# Patient Record
Sex: Male | Born: 1965
Health system: Southern US, Community
[De-identification: ages and names within clinical notes are randomized; demographics above are authoritative.]

## PROBLEM LIST (undated history)

## (undated) DIAGNOSIS — M1990 Unspecified osteoarthritis, unspecified site: Secondary | ICD-10-CM

## (undated) DIAGNOSIS — H9190 Unspecified hearing loss, unspecified ear: Secondary | ICD-10-CM

## (undated) DIAGNOSIS — H269 Unspecified cataract: Secondary | ICD-10-CM

## (undated) DIAGNOSIS — R000 Tachycardia, unspecified: Secondary | ICD-10-CM

## (undated) DIAGNOSIS — F79 Unspecified intellectual disabilities: Secondary | ICD-10-CM

## (undated) DIAGNOSIS — F840 Autistic disorder: Secondary | ICD-10-CM

## (undated) DIAGNOSIS — Z9289 Personal history of other medical treatment: Secondary | ICD-10-CM

## (undated) HISTORY — DX: Unspecified cataract: H26.9

## (undated) HISTORY — PX: INGUINAL HERNIA REPAIR: SUR1180

## (undated) HISTORY — PX: EYE SURGERY: SHX253

## (undated) HISTORY — PX: HERNIA REPAIR: SHX51

## (undated) HISTORY — DX: Unspecified intellectual disabilities: F79

## (undated) HISTORY — DX: Personal history of other medical treatment: Z92.89

---

## 2011-03-16 ENCOUNTER — Emergency Department (INDEPENDENT_AMBULATORY_CARE_PROVIDER_SITE_OTHER): Payer: 59

## 2011-03-16 ENCOUNTER — Encounter: Payer: Self-pay | Admitting: *Deleted

## 2011-03-16 ENCOUNTER — Emergency Department (INDEPENDENT_AMBULATORY_CARE_PROVIDER_SITE_OTHER)
Admission: EM | Admit: 2011-03-16 | Discharge: 2011-03-16 | Disposition: A | Discharge status: Home / Self Care | Payer: Medicare Other | Source: Home / Self Care | Attending: Emergency Medicine | Admitting: Emergency Medicine

## 2011-03-16 DIAGNOSIS — G629 Polyneuropathy, unspecified: Secondary | ICD-10-CM

## 2011-03-16 DIAGNOSIS — I872 Venous insufficiency (chronic) (peripheral): Secondary | ICD-10-CM

## 2011-03-16 DIAGNOSIS — H9190 Unspecified hearing loss, unspecified ear: Secondary | ICD-10-CM | POA: Insufficient documentation

## 2011-03-16 DIAGNOSIS — G609 Hereditary and idiopathic neuropathy, unspecified: Secondary | ICD-10-CM

## 2011-03-16 HISTORY — DX: Unspecified hearing loss, unspecified ear: H91.90

## 2011-03-16 LAB — POCT I-STAT, CHEM 8
Chloride: 106 mEq/L (ref 96–112)
HCT: 53 % — ABNORMAL HIGH (ref 39.0–52.0)
Potassium: 4 mEq/L (ref 3.5–5.1)

## 2011-03-16 MED ORDER — TRAMADOL HCL 50 MG PO TABS
100.0000 mg | ORAL_TABLET | Freq: Three times a day (TID) | ORAL | Status: AC | PRN
Start: 2011-03-16 — End: 2011-03-26

## 2011-03-16 NOTE — ED Provider Notes (Signed)
History     CSN: [id number] Arrival date & time: 03/16/2011  6:25 PM   First MD Initiated Contact with Patient 03/16/11 1736      Chief Complaint  Patient presents with  . Leg Pain    (Consider location/radiation/quality/duration/timing/severity/associated sxs/prior treatment) HPI Comments: Clif is a 45 year old male who is deaf, does not speak, does not use sign language, and cannot read or write. He communicates through his father who communicates with him by gestures. He has had a history for many months of pain in both of his ankles. Over the past week the pain seems to be moving up the legs into the bilateral pretibial areas. He seems to have pain when he walks. There is no swelling. It's hard to say whether there is any numbness or weakness because he can't communicate very well. He hasn't had any obvious systemic symptoms such as fever, chills, weight loss, abdominal pain, back pain, or urinary symptoms. He does not have a history of diabetes. He did complain of some lower back pain.  Patient is a 45 y.o. male presenting with leg pain.  Leg Pain  Pertinent negatives include no numbness.    Past Medical History  Diagnosis Date  . Deaf     Past Surgical History  Procedure Date  . Hernia repair     History reviewed. No pertinent family history.  History  Substance Use Topics  . Smoking status: Never Smoker   . Smokeless tobacco: Not on file  . Alcohol Use: No      Review of Systems  Musculoskeletal: Positive for back pain and arthralgias. Negative for myalgias, joint swelling and gait problem.  Skin: Negative for rash and wound.  Neurological: Negative for weakness and numbness.    Allergies  Review of patient's allergies indicates no known allergies.  Home Medications   Current Outpatient Rx  Name Route Sig Dispense Refill  . TRAMADOL HCL 50 MG PO TABS Oral Take 2 tablets (100 mg total) by mouth every 8 (eight) hours as needed for pain. Maximum dose= 8  tablets per day 30 tablet 0    BP 124/81  Pulse 89  Temp(Src) 98.5 F (36.9 C) (Oral)  Resp 18  SpO2 100%  Physical Exam  Nursing note and vitals reviewed. Constitutional: He appears well-developed and well-nourished. No distress.  Neck: No JVD present.  Cardiovascular: Normal rate, regular rhythm, normal heart sounds and intact distal pulses.  Exam reveals no gallop and no friction rub.   No murmur heard. Pulmonary/Chest: Effort normal and breath sounds normal. No respiratory distress. He has no wheezes. He has no rales.  Abdominal: Soft. Bowel sounds are normal. He exhibits no distension and no mass. There is no tenderness. There is no rebound and no guarding.  Musculoskeletal: Normal range of motion. He exhibits tenderness. He exhibits no edema.       He has tenderness in both pretibial areas. There is no calf tenderness and Homans sign appears to be negative. There is no edema. Ankles and knees have a full range of motion with no pain. Muscle strength is normal, posterior tibial pulses are full.  Lymphadenopathy:    He has no cervical adenopathy.  Skin: Skin is warm and dry. No rash noted. He is not diaphoretic.    ED Course  Procedures (including critical care time)  Results for orders placed during the hospital encounter of 03/16/11  POCT I-STAT, CHEM 8      Component Value Range   Sodium 141  135 - 145 (mEq/L)   Potassium 4.0  3.5 - 5.1 (mEq/L)   Chloride 106  96 - 112 (mEq/L)   BUN 16  6 - 23 (mg/dL)   Creatinine, Ser 1.61  0.50 - 1.35 (mg/dL)   Glucose, Bld 82  70 - 99 (mg/dL)   Calcium, Ion 0.96  0.45 - 1.32 (mmol/L)   TCO2 25  0 - 100 (mmol/L)   Hemoglobin 18.0 (*) 13.0 - 17.0 (g/dL)   HCT 40.9 (*) 81.1 - 52.0 (%)     Labs Reviewed  POCT I-STAT, CHEM 8 - Abnormal; Notable for the following:    Hemoglobin 18.0 (*)    HCT 53.0 (*)    All other components within normal limits  I-STAT, CHEM 8   Dg Tibia/fibula Left  03/16/2011  *RADIOLOGY REPORT*  Clinical  Data: Left leg pain.  LEFT TIBIA AND FIBULA - 2 VIEW  Comparison: None  Findings: The knee and ankle joints are maintained.  No acute bony findings.  IMPRESSION: No acute fracture.  Original Report Authenticated By: P. Loralie Champagne, M.D.   Dg Tibia/fibula Right  03/16/2011  *RADIOLOGY REPORT*  Clinical Data: Leg pain  RIGHT TIBIA AND FIBULA - 2 VIEW  Comparison: None.  Findings: No fracture or dislocation is seen.  The joint spaces are preserved.  The visualized soft tissues are unremarkable.  IMPRESSION: No acute osseous abnormality is seen.  Original Report Authenticated By: Charline Bills, M.D.     1. Venous insufficiency   2. Peripheral neuropathy       MDM  He has chronic bilateral pretibial pain. This is compatible either with venous insufficiency or neuropathy or both. All try support hose, tramadol for the pain, and have advised him to followup with his primary care physician in one week.        Roque Lias, MD 03/16/11 [phone]

## 2011-03-16 NOTE — ED Notes (Signed)
Pt deaf, father reports pt co pain in right lower leg, x longer than 1 week, ambulates with difficulty, denies injury to leg, no hx of medical problems

## 2011-04-27 ENCOUNTER — Encounter: Payer: Self-pay | Admitting: Internal Medicine

## 2011-04-27 DIAGNOSIS — Z0000 Encounter for general adult medical examination without abnormal findings: Secondary | ICD-10-CM | POA: Insufficient documentation

## 2011-04-29 ENCOUNTER — Emergency Department (INDEPENDENT_AMBULATORY_CARE_PROVIDER_SITE_OTHER)
Admission: EM | Admit: 2011-04-29 | Discharge: 2011-04-29 | Disposition: A | Discharge status: Home / Self Care | Payer: Medicare Other | Source: Home / Self Care | Attending: Emergency Medicine | Admitting: Emergency Medicine

## 2011-04-29 ENCOUNTER — Emergency Department (INDEPENDENT_AMBULATORY_CARE_PROVIDER_SITE_OTHER): Payer: 59

## 2011-04-29 ENCOUNTER — Encounter (HOSPITAL_COMMUNITY): Payer: Self-pay | Admitting: *Deleted

## 2011-04-29 DIAGNOSIS — R112 Nausea with vomiting, unspecified: Secondary | ICD-10-CM

## 2011-04-29 HISTORY — DX: Unspecified osteoarthritis, unspecified site: M19.90

## 2011-04-29 MED ORDER — ONDANSETRON HCL 4 MG PO TABS: 4.0000 mg | ORAL_TABLET | Freq: Four times a day (QID) | ORAL | Status: AC

## 2011-04-29 MED ORDER — OMEPRAZOLE 20 MG PO CPDR: 40.0000 mg | DELAYED_RELEASE_CAPSULE | Freq: Every day | ORAL | Status: DC

## 2011-04-29 NOTE — ED Notes (Signed)
Pt  Is deaf.  He is here with his father who states pt does not read lips or sign.    Onset 2 days ago generalized  ABD pain with N and V and D.    Last BM was this morning  And pt did not have much appetite.    Denies fever

## 2011-04-29 NOTE — ED Provider Notes (Signed)
History     CSN: [id number]  Arrival date & time 04/29/11  1102   First MD Initiated Contact with Patient 04/29/11 1131      Chief Complaint  Patient presents with  . Abdominal Pain    (Consider location/radiation/quality/duration/timing/severity/associated sxs/prior treatment) HPI Comments: X 2 days, with abdominal pains, and vomited x 3 yesterday and one this morning. [place] having diarrheas (father thinks) unable to confirm with their sign language) No fevers No SOB  Patient is a 46 y.o. male presenting with abdominal pain. The history is provided by a caregiver (deaf and mute). No language interpreter was used.  Abdominal Pain The primary symptoms of the illness include abdominal pain, nausea, vomiting and diarrhea. The primary symptoms of the illness do not include fever, fatigue, shortness of breath, dysuria, vaginal discharge or vaginal bleeding. The current episode started yesterday. The onset of the illness was gradual. The problem has not changed since onset. Additional symptoms associated with the illness include anorexia. Symptoms associated with the illness do not include chills, constipation or back pain.    Past Medical History  Diagnosis Date  . Deaf   . Osteoarthritis     Past Surgical History  Procedure Date  . Hernia repair   . Inguinal hernia repair     Family History  Problem Relation Age of Onset  . Diabetes Father   . Coronary artery disease Father     History  Substance Use Topics  . Smoking status: Never Smoker   . Smokeless tobacco: Not on file  . Alcohol Use: No      Review of Systems  Constitutional: Positive for appetite change. Negative for fever, chills and fatigue.  Respiratory: Positive for apnea. Negative for cough and shortness of breath.   Gastrointestinal: Positive for nausea, vomiting, abdominal pain, diarrhea and anorexia. Negative for constipation.  Genitourinary: Negative for dysuria, vaginal bleeding and vaginal  discharge.  Musculoskeletal: Negative for back pain.    Allergies  Review of patient's allergies indicates no known allergies.  Home Medications   Current Outpatient Rx  Name Route Sig Dispense Refill  . OMEPRAZOLE 20 MG PO CPDR Oral Take 2 capsules (40 mg total) by mouth daily. 15 capsule 0  . ONDANSETRON HCL 4 MG PO TABS Oral Take 1 tablet (4 mg total) by mouth every 6 (six) hours. 12 tablet 0    BP 119/77  Pulse 92  Temp(Src) 98.2 F (36.8 C) (Oral)  Resp 20  SpO2 95%  Physical Exam  Nursing note and vitals reviewed. Constitutional: No distress.  HENT:  Head: Normocephalic.  Mouth/Throat: No oropharyngeal exudate.  Eyes: Conjunctivae are normal. No scleral icterus.  Neck: Neck supple.  Pulmonary/Chest: No respiratory distress. He has no decreased breath sounds. He has no wheezes.  Abdominal: Soft. He exhibits distension. He exhibits no mass. There is no tenderness. There is no rebound and no guarding.  Lymphadenopathy:    He has no cervical adenopathy.  Skin: No rash noted. He is not diaphoretic.    ED Course  Procedures (including critical care time)  Labs Reviewed - No data to display Dg Abd 1 View  04/29/2011  *RADIOLOGY REPORT*  Clinical Data: Generalized abdominal pain, nausea, vomiting.  ABDOMEN - 1 VIEW  Comparison: None  Findings: There is a nonobstructive bowel gas pattern.  No supine evidence of free air.  No organomegaly or suspicious calcification. No acute bony abnormality.  IMPRESSION: Nonobstructive bowel gas pattern.  No acute findings.  Original Report Authenticated By: Caryn Bee  G. DOVER, M.D.     1. Nausea & vomiting       MDM  Abdominal pain x 2 days- N+V+D        Jimmie Molly, MD 04/29/11 [phone]

## 2011-05-02 ENCOUNTER — Other Ambulatory Visit (INDEPENDENT_AMBULATORY_CARE_PROVIDER_SITE_OTHER): Payer: 59

## 2011-05-02 ENCOUNTER — Ambulatory Visit (INDEPENDENT_AMBULATORY_CARE_PROVIDER_SITE_OTHER): Payer: 59 | Admitting: Internal Medicine

## 2011-05-02 ENCOUNTER — Encounter: Payer: Self-pay | Admitting: Internal Medicine

## 2011-05-02 VITALS — BP 102/62 | HR 76 | Temp 97.4°F | Ht 72.0 in | Wt 254.5 lb

## 2011-05-02 DIAGNOSIS — Z0000 Encounter for general adult medical examination without abnormal findings: Secondary | ICD-10-CM

## 2011-05-02 DIAGNOSIS — J45909 Unspecified asthma, uncomplicated: Secondary | ICD-10-CM | POA: Insufficient documentation

## 2011-05-02 DIAGNOSIS — F79 Unspecified intellectual disabilities: Secondary | ICD-10-CM

## 2011-05-02 DIAGNOSIS — H269 Unspecified cataract: Secondary | ICD-10-CM | POA: Insufficient documentation

## 2011-05-02 DIAGNOSIS — M1990 Unspecified osteoarthritis, unspecified site: Secondary | ICD-10-CM

## 2011-05-02 HISTORY — DX: Unspecified intellectual disabilities: F79

## 2011-05-02 HISTORY — DX: Unspecified cataract: H26.9

## 2011-05-02 LAB — URINALYSIS, ROUTINE W REFLEX MICROSCOPIC
Leukocytes, UA: NEGATIVE
Nitrite: NEGATIVE
Specific Gravity, Urine: 1.015 (ref 1.000–1.030)
Urine Glucose: NEGATIVE
Urobilinogen, UA: 0.2 (ref 0.0–1.0)

## 2011-05-02 LAB — LIPID PANEL
Cholesterol: 175 mg/dL (ref 0–200)
HDL: 24.2 mg/dL — ABNORMAL LOW (ref >39.00)
VLDL: 27 mg/dL (ref 0.0–40.0)

## 2011-05-02 LAB — BASIC METABOLIC PANEL
BUN: 12 mg/dL (ref 6–23)
Calcium: 8.1 mg/dL — ABNORMAL LOW (ref 8.4–10.5)
GFR: 84.92 mL/min (ref >60.00)
Glucose, Bld: 74 mg/dL (ref 70–99)
Potassium: 3.2 mEq/L — ABNORMAL LOW (ref 3.5–5.1)

## 2011-05-02 LAB — CBC WITH DIFFERENTIAL/PLATELET
Eosinophils Relative: 2.9 % (ref 0.0–5.0)
HCT: 44.6 % (ref 39.0–52.0)
Lymphocytes Relative: 21.3 % (ref 12.0–46.0)
Lymphs Abs: 1.6 10*3/uL (ref 0.7–4.0)
Monocytes Relative: 11.5 % (ref 3.0–12.0)
Platelets: 321 10*3/uL (ref 150.0–400.0)
WBC: 7.6 10*3/uL (ref 4.5–10.5)

## 2011-05-02 LAB — HEPATIC FUNCTION PANEL
AST: 32 U/L (ref 0–37)
Albumin: 3.4 g/dL — ABNORMAL LOW (ref 3.5–5.2)

## 2011-05-02 LAB — TSH: TSH: 3.8 u[IU]/mL (ref 0.35–5.50)

## 2011-05-02 LAB — PSA: PSA: 0.3 ng/mL (ref 0.10–4.00)

## 2011-05-02 MED ORDER — DICLOFENAC SODIUM 1 % TD GEL: 1.0000 "application " | Freq: Four times a day (QID) | TRANSDERMAL | Status: DC

## 2011-05-02 NOTE — Assessment & Plan Note (Signed)
Mild symptoms, will hold on imaging, for voltaren gel prn, to f/u any worsening symptoms or concerns

## 2011-05-02 NOTE — Patient Instructions (Signed)
Take all new medications as prescribed Continue all other medications as before Please go to LAB in the Basement for the blood and/or urine tests to be done today Please call the phone number [phone] (the [employer] System) for results of testing in 2-3 days;  When calling, simply dial the number, and when prompted enter the MRN number above (the Medical Record Number) and the # key, then the message should start. Please return in 1 year for your yearly visit, or sooner if needed, with Lab testing done 3-5 days before  

## 2011-05-02 NOTE — Progress Notes (Signed)
Subjective:    Patient ID: Daniel Summers, male    DOB: 11-28-1965, 46 y.o.   MRN: [id number]  HPIHere as new - Here for wellness and f/u;  Pt with mild mental retardation, does not do sign language; disabled on SSI due to deaf and MR;  Hx per fathere - Overall doing ok;  Pt denies CP, worsening SOB, DOE, wheezing, orthopnea, PND, worsening LE edema, palpitations, dizziness or syncope.  Pt denies neurological change such as new Headache, facial or extremity weakness.  Pt denies polydipsia, polyuria, or low sugar symptoms. Pt states overall good compliance with treatment and medications, good tolerability, and trying to follow lower cholesterol diet.  Pt denies worsening depressive symptoms, suicidal ideation or panic. No fever, wt loss, night sweats, loss of appetite, or other constitutional symptoms.  Pt states good ability with ADL's, low fall risk, home safety reviewed and adequate, no significant changes in hearing or vision, and occasionally active with exercise.  Does have recurrent right ankle pain with trace swelling with longer standing during the day, father reqeusts tx, not always better with otc advil Past Medical History  Diagnosis Date  . Deaf   . History of blood transfusion     At [facility]  . Asthma   . Osteoarthritis     right ankle  . Mental retardation 05/02/2011  . Bilateral cataracts 05/02/2011   Past Surgical History  Procedure Date  . Hernia repair   . Inguinal hernia repair     reports that he has never smoked. He has never used smokeless tobacco. He reports that he does not drink alcohol or use illicit drugs. family history includes Alcohol abuse in his other; Coronary artery disease in his father; Diabetes in his father and other; Heart disease in his other; and Hypertension in his other. No Known Allergies Current Outpatient Prescriptions on File Prior to Visit  Medication Sig Dispense Refill  . omeprazole (PRILOSEC) 20 MG capsule Take 2 capsules (40 mg total) by mouth  daily.  15 capsule  0  . ondansetron (ZOFRAN) 4 MG tablet Take 1 tablet (4 mg total) by mouth every 6 (six) hours.  12 tablet  0   Review of Systems Review of Systems  Constitutional: Negative for diaphoresis, activity change, appetite change and unexpected weight change.  HENT: Negative for hearing loss, ear pain, facial swelling, mouth sores and neck stiffness.   Eyes: Negative for pain, redness and visual disturbance.  Respiratory: Negative for shortness of breath and wheezing.   Cardiovascular: Negative for chest pain and palpitations.  Gastrointestinal: Negative for diarrhea, blood in stool, abdominal distention and rectal pain.  Genitourinary: Negative for hematuria, flank pain and decreased urine volume.  Musculoskeletal: Negative for myalgias and joint swelling.  Skin: Negative for color change and wound.  Neurological: Negative for syncope and numbness.  Hematological: Negative for adenopathy.  Psychiatric/Behavioral: Negative for hallucinations, self-injury, decreased concentration and agitation.      Objective:   Physical Exam BP 102/62  Pulse 76  Temp(Src) 97.4 F (36.3 C) (Oral)  Ht 6' (1.829 m)  Wt 254 lb 8 oz (115.44 kg)  BMI 34.52 kg/m2  SpO2 94% Physical Exam  VS noted, obese Constitutional: Pt is oriented to person, place, and time. Appears well-developed and well-nourished.  HENT:  Head: Normocephalic and atraumatic.  Right Ear: External ear normal.  Left Ear: External ear normal.  Nose: Nose normal.  Mouth/Throat: Oropharynx is clear and moist.  Eyes: Conjunctivae and EOM are normal. Pupils are equal, round,  and reactive to light.  Neck: Normal range of motion. Neck supple. No JVD present. No tracheal deviation present.  Cardiovascular: Normal rate, regular rhythm, normal heart sounds and intact distal pulses.   Pulmonary/Chest: Effort normal and breath sounds normal.  Abdominal: Soft. Bowel sounds are normal. There is no tenderness.  Musculoskeletal:  Normal range of motion. Exhibits no edema.  Lymphadenopathy:  Has no cervical adenopathy.  Neurological: Pt is alert and oriented to person, place, and time. Pt has normal reflexes. No cranial nerve deficit. Except complete deaf bilat, mild MR likely Skin: Skin is warm and dry. No rash noted. Right ankle NT, FROM, no effusion today  Psychiatric:  Has  normal mood and affect. Behavior is normal.     Assessment & Plan:

## 2011-05-02 NOTE — Assessment & Plan Note (Signed)

## 2011-12-02 ENCOUNTER — Encounter: Payer: Self-pay | Admitting: Internal Medicine

## 2011-12-02 ENCOUNTER — Ambulatory Visit (INDEPENDENT_AMBULATORY_CARE_PROVIDER_SITE_OTHER): Payer: 59 | Admitting: Internal Medicine

## 2011-12-02 VITALS — BP 102/70 | HR 95 | Temp 98.3°F | Ht 69.0 in | Wt 263.2 lb

## 2011-12-02 DIAGNOSIS — M545 Low back pain, unspecified: Secondary | ICD-10-CM

## 2011-12-02 DIAGNOSIS — M25579 Pain in unspecified ankle and joints of unspecified foot: Secondary | ICD-10-CM

## 2011-12-02 DIAGNOSIS — M25519 Pain in unspecified shoulder: Secondary | ICD-10-CM

## 2011-12-02 DIAGNOSIS — M25511 Pain in right shoulder: Secondary | ICD-10-CM

## 2011-12-02 DIAGNOSIS — M25571 Pain in right ankle and joints of right foot: Secondary | ICD-10-CM

## 2011-12-02 MED ORDER — OMEPRAZOLE 20 MG PO CPDR: 40.0000 mg | DELAYED_RELEASE_CAPSULE | Freq: Every day | ORAL | Status: DC

## 2011-12-02 MED ORDER — NAPROXEN 500 MG PO TABS: 500.0000 mg | ORAL_TABLET | Freq: Two times a day (BID) | ORAL | Status: DC

## 2011-12-02 MED ORDER — CYCLOBENZAPRINE HCL 5 MG PO TABS: 5.0000 mg | ORAL_TABLET | Freq: Three times a day (TID) | ORAL | Status: AC | PRN

## 2011-12-02 NOTE — Assessment & Plan Note (Signed)
Exam benign, suspect msk strain vs underlying djd or ddd, for flexeril and nsaid prn, with PPI prophylaxis

## 2011-12-02 NOTE — Assessment & Plan Note (Signed)
With mild AC joint tender and suspect bicipital tendonitis - for nsaid prn,  to f/u any worsening symptoms or concerns

## 2011-12-02 NOTE — Assessment & Plan Note (Signed)
?   DJD - mild, declines films today,  to f/u any worsening symptoms or concerns

## 2011-12-02 NOTE — Progress Notes (Signed)
  Subjective:    Patient ID: Daniel Summers, male    DOB: May 11, 1965, 46 y.o.   MRN: [id number]  HPI  Pt here with father in wheelchair;  Pt normally involved in pushing father in wheelchair most days;   pt is deaf, does not sign, near illiterate but father states he has made complaints or less than 1 mo worsening right shoulder pain, mid lower back pain ( without change in severity, bowel or bladder change, fever, wt loss,  worsening LE pain/numbness/weakness, gait change or falls), worse to stand up, also bilat ankle pain with some swelling on the right after mowing the grass 2 days ago.  Topical nsaid no longer working.  Has had mild worsening reflux, but no dysphagia, abd pain, n/v, bowel change or blood. Pt denies fever, wt loss, night sweats, loss of appetite, or other constitutional symptoms Past Medical History  Diagnosis Date  . Deaf   . History of blood transfusion     At [facility]  . Asthma   . Osteoarthritis     right ankle  . Mental retardation 05/02/2011  . Bilateral cataracts 05/02/2011   Past Surgical History  Procedure Date  . Hernia repair   . Inguinal hernia repair     reports that he has never smoked. He has never used smokeless tobacco. He reports that he does not drink alcohol or use illicit drugs. family history includes Alcohol abuse in his other; Coronary artery disease in his father; Diabetes in his father and other; Heart disease in his other; and Hypertension in his other. No Known Allergies Current Outpatient Prescriptions on File Prior to Visit  Medication Sig Dispense Refill  . DISCONTD: omeprazole (PRILOSEC) 20 MG capsule Take 2 capsules (40 mg total) by mouth daily.  15 capsule  0   Review of Systems Constitutional: Negative for diaphoresis and unexpected weight change.  HENT: Negative for tinnitus.   Eyes: Negative for photophobia and visual disturbance.  Respiratory: Negative for choking and stridor.   Gastrointestinal: Negative for vomiting and blood in stool.   Genitourinary: Negative for hematuria and decreased urine volume.   Skin: Negative for color change and wound.  Neurological: Negative for tremors and numbness.      Objective:   Physical Exam BP 102/70  Pulse 95  Temp 98.3 F (36.8 C) (Oral)  Ht 5\' 9"  (1.753 m)  Wt 263 lb 4 oz (119.409 kg)  BMI 38.88 kg/m2  SpO2 94% Physical Exam  VS noted, not ill appearing Constitutional: Pt appears well-developed and well-nourished.  HENT: Head: Normocephalic.  Right Ear: External ear normal.  Left Ear: External ear normal.  Eyes: Conjunctivae and EOM are normal. Pupils are equal, round, and reactive to light.  Neck: Normal range of motion. Neck supple.  Cardiovascular: Normal rate and regular rhythm.   Pulmonary/Chest: Effort normal and breath sounds normal.  Spine: nontender, has mild to mod right paravertebral tender/spasn Right shoulder FROM, some tender to right AC joint and bicipital tendon insertion site Left ankle FROM, NT, no effusion Right ankle FROM, mild tender diffuse with small effusion Neurological: Pt is alert. Not confused Skin: Skin is warm. No erythema. No ulcer or pedal edema  Psychiatric: Pt behavior is normal    Assessment & Plan:

## 2011-12-02 NOTE — Patient Instructions (Addendum)
Take all new medications as prescribed - the anti-inflammatory for pain, and muscle relaxer for back pain as well - both as needed Continue all other medications as before, including the stomach acid medication, as this helps protect against stomach ulcers Please call if not improved in 1 -2 weeks, for orthopedic referral if not better Please return in Feb 2014 with blood work done 3-5 days ahead

## 2012-05-05 ENCOUNTER — Other Ambulatory Visit (INDEPENDENT_AMBULATORY_CARE_PROVIDER_SITE_OTHER): Payer: 59

## 2012-05-05 ENCOUNTER — Ambulatory Visit (INDEPENDENT_AMBULATORY_CARE_PROVIDER_SITE_OTHER): Payer: 59 | Admitting: Internal Medicine

## 2012-05-05 ENCOUNTER — Encounter: Payer: Self-pay | Admitting: Internal Medicine

## 2012-05-05 ENCOUNTER — Telehealth: Payer: Self-pay | Admitting: Internal Medicine

## 2012-05-05 VITALS — BP 110/62 | HR 92 | Temp 96.5°F | Wt 284.4 lb

## 2012-05-05 DIAGNOSIS — L84 Corns and callosities: Secondary | ICD-10-CM | POA: Insufficient documentation

## 2012-05-05 DIAGNOSIS — J45909 Unspecified asthma, uncomplicated: Secondary | ICD-10-CM

## 2012-05-05 DIAGNOSIS — Z0000 Encounter for general adult medical examination without abnormal findings: Secondary | ICD-10-CM

## 2012-05-05 DIAGNOSIS — M545 Low back pain, unspecified: Secondary | ICD-10-CM

## 2012-05-05 LAB — HEPATIC FUNCTION PANEL
ALT: 69 U/L — ABNORMAL HIGH (ref 0–53)
Albumin: 3.6 g/dL (ref 3.5–5.2)
Bilirubin, Direct: 0.1 mg/dL (ref 0.0–0.3)
Total Protein: 6.7 g/dL (ref 6.0–8.3)

## 2012-05-05 LAB — CBC WITH DIFFERENTIAL/PLATELET
Basophils Absolute: 0.1 10*3/uL (ref 0.0–0.1)
Basophils Relative: 1 % (ref 0.0–3.0)
Eosinophils Absolute: 0.2 10*3/uL (ref 0.0–0.7)
Hemoglobin: 16.6 g/dL (ref 13.0–17.0)
Lymphocytes Relative: 14.5 % (ref 12.0–46.0)
MCHC: 33.9 g/dL (ref 30.0–36.0)
Monocytes Relative: 11.2 % (ref 3.0–12.0)
Neutrophils Relative %: 71 % (ref 43.0–77.0)
RBC: 5.33 Mil/uL (ref 4.22–5.81)
WBC: 9 10*3/uL (ref 4.5–10.5)

## 2012-05-05 LAB — BASIC METABOLIC PANEL
BUN: 15 mg/dL (ref 6–23)
CO2: 25 mEq/L (ref 19–32)
Calcium: 8.8 mg/dL (ref 8.4–10.5)
Chloride: 104 mEq/L (ref 96–112)
Creatinine, Ser: 1.2 mg/dL (ref 0.4–1.5)
Glucose, Bld: 83 mg/dL (ref 70–99)

## 2012-05-05 LAB — URINALYSIS, ROUTINE W REFLEX MICROSCOPIC
Hgb urine dipstick: NEGATIVE
Nitrite: NEGATIVE
Specific Gravity, Urine: 1.02 (ref 1.000–1.030)
Total Protein, Urine: NEGATIVE
Urobilinogen, UA: 0.2 (ref 0.0–1.0)

## 2012-05-05 LAB — LIPID PANEL
Cholesterol: 245 mg/dL — ABNORMAL HIGH (ref 0–200)
Triglycerides: 151 mg/dL — ABNORMAL HIGH (ref 0.0–149.0)

## 2012-05-05 MED ORDER — ATORVASTATIN CALCIUM 20 MG PO TABS: 20.0000 mg | ORAL_TABLET | Freq: Every day | ORAL | Status: DC

## 2012-05-05 MED ORDER — TRAMADOL HCL 50 MG PO TABS: 50.0000 mg | ORAL_TABLET | Freq: Four times a day (QID) | ORAL | Status: DC | PRN

## 2012-05-05 NOTE — Assessment & Plan Note (Signed)
To podiatry

## 2012-05-05 NOTE — Progress Notes (Signed)
Subjective:    Patient ID: Daniel Summers, male    DOB: [birth year], 47 y.o.   MRN: [id number]  HPI  Here for wellness and f/u;  Overall doing ok;  Pt denies CP, worsening SOB, DOE, wheezing, orthopnea, PND, worsening LE edema, palpitations, dizziness or syncope.  Pt denies neurological change such as new headache, facial or extremity weakness.  Pt denies polydipsia, polyuria, or low sugar symptoms. Pt states overall good compliance with treatment and medications, good tolerability, and has not really been trying to follow lower cholesterol diet.  Pt denies worsening depressive symptoms, suicidal ideation or panic. No fever, night sweats, wt loss, loss of appetite, or other constitutional symptoms.  Pt states good ability with ADL's, has low fall risk, home safety reviewed and adequate, no other significant changes in vision, has chronic deafness, and only rarely to occasionally active with exercise.  Pt continues to have recurring LBP without change in severity, bowel or bladder change, fever, wt loss,  worsening LE pain/numbness/weakness, gait change or falls, naproxen no longer working, needs other pain med, and also with mult calouses to the feet with pain with walking as well.  Past Medical History  Diagnosis Date  . Deaf   . History of blood transfusion     At [facility]  . Asthma   . Osteoarthritis     right ankle  . Mental retardation 05/02/2011  . Bilateral cataracts 05/02/2011   Past Surgical History  Procedure Date  . Hernia repair   . Inguinal hernia repair     reports that he has never smoked. He has never used smokeless tobacco. He reports that he does not drink alcohol or use illicit drugs. family history includes Alcohol abuse in his other; Coronary artery disease in his father; Diabetes in his father and other; Heart disease in his other; and Hypertension in his other. No Known Allergies Current Outpatient Prescriptions on File Prior to Visit  Medication Sig Dispense Refill  .  omeprazole (PRILOSEC) 20 MG capsule Take 2 capsules (40 mg total) by mouth daily.  180 capsule  3   Review of Systems Constitutional: Negative for diaphoresis, activity change, appetite change or unexpected weight change.  HENT: Negative for hearing loss, ear pain, facial swelling, mouth sores and neck stiffness.   Eyes: Negative for pain, redness and visual disturbance.  Respiratory: Negative for shortness of breath and wheezing.   Cardiovascular: Negative for chest pain and palpitations.  Gastrointestinal: Negative for diarrhea, blood in stool, abdominal distention or other pain Genitourinary: Negative for hematuria, flank pain or change in urine volume.  Musculoskeletal: Negative for myalgias and joint swelling.  Skin: Negative for color change and wound.  Neurological: Negative for syncope and numbness. other than noted Hematological: Negative for adenopathy.  Psychiatric/Behavioral: Negative for hallucinations, self-injury, decreased concentration and agitation.      Objective:   Physical Exam BP 110/62  Pulse 92  Temp 96.5 F (35.8 C) (Oral)  Wt 284 lb 6 oz (128.992 kg)  SpO2 97% VS noted,  Constitutional: Pt is oriented to person, place, and time. Appears well-developed and well-nourished.  Head: Normocephalic and atraumatic.  Right Ear: External ear normal.  Left Ear: External ear normal.  Nose: Nose normal.  Mouth/Throat: Oropharynx is clear and moist.  Eyes: Conjunctivae and EOM are normal. Pupils are equal, round, and reactive to light.  Neck: Normal range of motion. Neck supple. No JVD present. No tracheal deviation present.  Cardiovascular: Normal rate, regular rhythm, normal heart sounds and intact distal  pulses.   Pulmonary/Chest: Effort normal and breath sounds normal.  Abdominal: Soft. Bowel sounds are normal. There is no tenderness. No HSM  Musculoskeletal: Normal range of motion. Exhibits no edema.  Lymphadenopathy:  Has no cervical adenopathy.  Neurological:  Pt is alert and oriented to person, place, and time. Pt has normal reflexes. No cranial nerve deficit. Except for deafness, motor/dtr intact Skin: Skin is warm and dry. No rash noted. Has mult severe callous to both feet primarily heel and first MTP, great toe and lateral 5th MTP area, tender but no ulcer and no cellulitis Psychiatric:  Has  normal mood and affect. Behavior is normal.  Spine: nontender Grimaces to sitting up and lying down, or any position change involving the lower back    Assessment & Plan:

## 2012-05-05 NOTE — Assessment & Plan Note (Signed)
Recurrent, no neuro changes but more severe subjectively, to d/c naprosyn, for tramadol prn, refer orthopedic

## 2012-05-05 NOTE — Assessment & Plan Note (Signed)
stable overall by history and exam, recent data reviewed with pt, and pt to continue medical treatment as before,  to f/u any worsening symptoms or concerns SpO2 Readings from Last 3 Encounters:  05/05/12 97%  12/02/11 94%  05/02/11 94%

## 2012-05-05 NOTE — Assessment & Plan Note (Signed)

## 2012-05-05 NOTE — Telephone Encounter (Signed)
Pt to start lipitor 20 due to elev LDL chol;  Robin to call father, or wait until father comes for appt (father has appt feb 6)

## 2012-05-05 NOTE — Patient Instructions (Addendum)
Please take all new medication as prescribed - the new pain medication OK to stop the naprosyn  Please continue all other medications as before (the prilosec) You will be contacted regarding the referral for: podiatry (foot doctor for the callouses), and orthopedic for the back pain Please continue your efforts at being more active, low cholesterol diet, and weight control. You are otherwise up to date with prevention measures today. Please go to the LAB in the Basement (turn left off the elevator) for the tests to be done today You will be contacted by phone if any changes need to be made immediately.  Otherwise, you will receive a letter about your results with an explanation Please remember to sign up for My Chart if you have not done so, as this will be important to you in the future with finding out test results, communicating by private email, and scheduling acute appointments online when needed.

## 2012-05-06 NOTE — Telephone Encounter (Signed)
Patient's father informed of results.

## 2012-07-28 ENCOUNTER — Encounter: Payer: Self-pay | Admitting: Internal Medicine

## 2012-07-28 ENCOUNTER — Ambulatory Visit (INDEPENDENT_AMBULATORY_CARE_PROVIDER_SITE_OTHER): Payer: 59 | Admitting: Internal Medicine

## 2012-07-28 VITALS — BP 102/58 | HR 82 | Temp 97.0°F | Wt 273.0 lb

## 2012-07-28 DIAGNOSIS — J209 Acute bronchitis, unspecified: Secondary | ICD-10-CM

## 2012-07-28 DIAGNOSIS — J45901 Unspecified asthma with (acute) exacerbation: Secondary | ICD-10-CM | POA: Insufficient documentation

## 2012-07-28 MED ORDER — HYDROCODONE-HOMATROPINE 5-1.5 MG/5ML PO SYRP: 5.0000 mL | ORAL_SOLUTION | Freq: Four times a day (QID) | ORAL | Status: DC | PRN

## 2012-07-28 MED ORDER — LEVOFLOXACIN 250 MG PO TABS: 250.0000 mg | ORAL_TABLET | Freq: Every day | ORAL | Status: DC

## 2012-07-28 MED ORDER — PREDNISONE 10 MG PO TABS: ORAL_TABLET | ORAL | Status: DC

## 2012-07-28 NOTE — Assessment & Plan Note (Signed)
Mild to mod, for antibx course,  to f/u any worsening symptoms or concerns 

## 2012-07-28 NOTE — Assessment & Plan Note (Signed)
Mild to mod, for predpack asd,  to f/u any worsening symptoms or concerns 

## 2012-07-28 NOTE — Progress Notes (Signed)
  Subjective:    Patient ID: Daniel Summers, male    DOB: 1965-12-01, 47 y.o.   MRN: [id number]  HPI  Here with acute onset mild to mod 2-3 days ST, HA, general weakness and malaise, with prod cough greenish sputum, but Pt denies chest pain, increased sob or doe, wheezing, orthopnea, PND, increased LE swelling, palpitations, dizziness or syncope, except for onset wheezing last night with mild DOE this am. Pt denies new neurological symptoms such as new headache, or facial or extremity weakness or numbness  Here with father who has their own version of sign language Past Medical History  Diagnosis Date  . Deaf   . History of blood transfusion     At [facility]  . Asthma   . Osteoarthritis     right ankle  . Mental retardation 05/02/2011  . Bilateral cataracts 05/02/2011   Past Surgical History  Procedure Laterality Date  . Hernia repair    . Inguinal hernia repair      reports that he has never smoked. He has never used smokeless tobacco. He reports that he does not drink alcohol or use illicit drugs. family history includes Alcohol abuse in his other; Coronary artery disease in his father; Diabetes in his father and other; Heart disease in his other; and Hypertension in his other. No Known Allergies Current Outpatient Prescriptions on File Prior to Visit  Medication Sig Dispense Refill  . atorvastatin (LIPITOR) 20 MG tablet Take 1 tablet (20 mg total) by mouth daily.  90 tablet  3  . omeprazole (PRILOSEC) 20 MG capsule Take 2 capsules (40 mg total) by mouth daily.  180 capsule  3  . traMADol (ULTRAM) 50 MG tablet Take 1 tablet (50 mg total) by mouth every 6 (six) hours as needed for pain.  120 tablet  1   No current facility-administered medications on file prior to visit.    Review of Systems  All otherwise neg per pt       Objective:   Physical Exam BP 102/58  Pulse 82  Temp(Src) 97 F (36.1 C) (Oral)  Wt 273 lb (123.832 kg)  BMI 40.3 kg/m2  SpO2 97% VS noted, mild  ill Constitutional: Pt appears well-developed and well-nourished.  HENT: Head: NCAT.  Right Ear: External ear normal.  Left Ear: External ear normal.  Bilat tm's with mild erythema.  Max sinus areas mild tender.  Pharynx with mild erythema, no exudate Eyes: Conjunctivae and EOM are normal. Pupils are equal, round, and reactive to light.  Neck: Normal range of motion. Neck supple.  Cardiovascular: Normal rate and regular rhythm.   Pulmonary/Chest: Effort normal and breath sounds decreased with bilat few insp wheezes polyphonic Neurological: Pt is alert. Not confused  Skin: Skin is warm. No erythema.  Psychiatric: Pt behavior is normal. Thought content normal.     Assessment & Plan:

## 2012-07-28 NOTE — Patient Instructions (Signed)
Please take all new medication as prescribed Please continue all other medications as before, and refills have been done if requested.  

## 2012-11-03 ENCOUNTER — Other Ambulatory Visit: Payer: Self-pay | Admitting: Internal Medicine

## 2012-11-03 DIAGNOSIS — G4710 Hypersomnia, unspecified: Secondary | ICD-10-CM

## 2012-12-08 ENCOUNTER — Ambulatory Visit (INDEPENDENT_AMBULATORY_CARE_PROVIDER_SITE_OTHER): Payer: 59 | Admitting: Pulmonary Disease

## 2012-12-08 ENCOUNTER — Encounter: Payer: Self-pay | Admitting: Pulmonary Disease

## 2012-12-08 VITALS — BP 100/64 | HR 103 | Temp 98.2°F | Ht 70.0 in | Wt 291.4 lb

## 2012-12-08 DIAGNOSIS — G4733 Obstructive sleep apnea (adult) (pediatric): Secondary | ICD-10-CM | POA: Insufficient documentation

## 2012-12-08 NOTE — Assessment & Plan Note (Signed)
The patient is not able to communicate without a formal interpreter, but his father has clearly heard loud snoring and is also concerned about the possibility of sleep apnea.  He states that he will fall asleep anytime that he sits down, and the patient is obese with a very abnormal upper airway.  My suspicion is very high that he has clinically significant sleep apnea, and we'll therefore refer him for a sleep study.

## 2012-12-08 NOTE — Progress Notes (Signed)
Subjective:    Patient ID: Daniel Summers, male    DOB: Jul 28, 1965, 47 y.o.   MRN: [id number]  HPI The patient is a 47 year old male who I have been asked to see for possible obstructive sleep apnea.  The patient is deaf and has mental retardation, and unfortunately there is no interpreter available.  The father is able to give me information, however the patient is not able to communicate.  The father notes that he has very loud snoring, but is unsure if he has an abnormal breathing pattern during sleep.  His sleep hygiene is totally unknown.  The father states that he will fall asleep anytime he sits down during the day or evening.  He also has had significant weight gain, but he is unsure how much.   Sleep Questionnaire What time do you typically go to bed?( Between what hours) varies--father not sure when he goes to sleep varies--father not sure when he goes to sleep at 1501 on 12/08/12 by Maisie Fus, CMA How long does it take you to fall asleep? father not sure how long it takes son to fall asleep father not sure how long it takes son to fall asleep at 1501 on 12/08/12 by Maisie Fus, CMA How many times during the night do you wake up? No Value unsure at 1501 on 12/08/12 by Maisie Fus, CMA What time do you get out of bed to start your day? No Value unsure at 1501 on 12/08/12 by Maisie Fus, CMA Do you drive or operate heavy machinery in your occupation? No No at 1501 on 12/08/12 by Maisie Fus, CMA How much has your weight changed (up or down) over the past two years? (In pounds) 50 lb (22.68 kg) 50 lb (22.68 kg) at 1501 on 12/08/12 by Maisie Fus, CMA Have you ever had a sleep study before? No No at 1501 on 12/08/12 by Maisie Fus, CMA Do you currently use CPAP? NoNo father states pt will be put on his CPAP some nights at 1501 on 12/08/12 by Maisie Fus, CMA Do you wear oxygen at any time? No No at 1501 on 12/08/12 by Maisie Fus, CMA   Review of  Systems  Constitutional: Negative for fever and unexpected weight change.  HENT: Negative for ear pain, nosebleeds, congestion, sore throat, rhinorrhea, sneezing, trouble swallowing, dental problem, postnasal drip and sinus pressure.   Eyes: Negative for redness and itching.  Respiratory: Positive for cough and shortness of breath. Negative for chest tightness and wheezing.   Cardiovascular: Negative for palpitations and leg swelling.  Gastrointestinal: Negative for nausea and vomiting.  Genitourinary: Negative for dysuria.  Musculoskeletal: Negative for joint swelling.  Skin: Negative for rash.  Neurological: Negative for headaches.  Hematological: Does not bruise/bleed easily.  Psychiatric/Behavioral: Negative for dysphoric mood. The patient is not nervous/anxious.        Objective:   Physical Exam Constitutional:  Obese male, no acute distress  HENT:  Nares patent without discharge  Oropharynx without exudate, palate and uvula are very thick and elongated.  Eyes:  Perrla, eomi, no scleral icterus  Neck:  No JVD, no TMG  Cardiovascular:  Normal rate, regular rhythm, no rubs or gallops.  No murmurs        Intact distal pulses  Pulmonary :  Normal breath sounds, no stridor or respiratory distress   No rales, rhonchi, or wheezing  Abdominal:  Soft, nondistended, bowel sounds present.  No tenderness noted.  Musculoskeletal:  minimal lower extremity edema noted.  Lymph Nodes:  No cervical lymphadenopathy noted  Skin:  No cyanosis noted  Neurologic:  Appears sleepy, moves all 4 extremities without obvious deficit.         Assessment & Plan:

## 2012-12-08 NOTE — Patient Instructions (Addendum)
Will refer for a sleep study. Will arrange for followup once the results are available.

## 2012-12-21 ENCOUNTER — Ambulatory Visit: Payer: 59 | Admitting: Internal Medicine

## 2013-01-12 ENCOUNTER — Ambulatory Visit (HOSPITAL_BASED_OUTPATIENT_CLINIC_OR_DEPARTMENT_OTHER): Payer: Medicare HMO | Attending: Pulmonary Disease | Admitting: Radiology

## 2013-01-12 VITALS — Ht 70.0 in | Wt 265.0 lb

## 2013-01-12 DIAGNOSIS — G4733 Obstructive sleep apnea (adult) (pediatric): Secondary | ICD-10-CM

## 2013-01-17 ENCOUNTER — Encounter: Payer: Self-pay | Admitting: Family Medicine

## 2013-01-17 ENCOUNTER — Ambulatory Visit (INDEPENDENT_AMBULATORY_CARE_PROVIDER_SITE_OTHER): Payer: 59 | Admitting: Family Medicine

## 2013-01-17 VITALS — BP 134/74 | HR 76

## 2013-01-17 DIAGNOSIS — M6788 Other specified disorders of synovium and tendon, other site: Secondary | ICD-10-CM | POA: Insufficient documentation

## 2013-01-17 DIAGNOSIS — M7552 Bursitis of left shoulder: Secondary | ICD-10-CM

## 2013-01-17 DIAGNOSIS — M7660 Achilles tendinitis, unspecified leg: Secondary | ICD-10-CM

## 2013-01-17 DIAGNOSIS — M545 Low back pain, unspecified: Secondary | ICD-10-CM

## 2013-01-17 DIAGNOSIS — M67919 Unspecified disorder of synovium and tendon, unspecified shoulder: Secondary | ICD-10-CM

## 2013-01-17 MED ORDER — MELOXICAM 15 MG PO TABS: 15.0000 mg | ORAL_TABLET | Freq: Every day | ORAL | Status: DC

## 2013-01-17 NOTE — Assessment & Plan Note (Signed)
Diagnosis, prognosis and rehabilitation discussed. Handout given of exercises. He'll let us given to him placed by me today. Discussed icing protocol Discussed the importance of stretching Discussed proper shoe wear Discussed losing weight Return in 3-4 weeks. He continues to have pain I would get x-rays of the ankles bilaterally secondary to the communication barrier, this will make sure that we are not missing anything.

## 2013-01-17 NOTE — Assessment & Plan Note (Signed)
Patient did have injection of the left shoulder for subacromial bursitis today. Patient did respond extremely well. Patient told that this could affect his blood sugars somewhat. The patient is unable to really take anti-inflammatories on a regular basis.

## 2013-01-17 NOTE — Assessment & Plan Note (Signed)
Mechanical in nature Anti-inflammatories to try to decrease any inflammation. Discuss weight loss Discussed need for potential x-rays if not better in 3-4 weeks. Home exercise program given. Return in 3-4 weeks

## 2013-01-17 NOTE — Patient Instructions (Addendum)
Very good to see you Try these exercises daily Achilles Rehab  Begin with easy walking, heel, toe and backwards  Calf raises on a step First lower and then raise on 1 foot If this is painful lower on 1 foot but do the heel raise on both feet  Begin with 3 sets of 10 repetitions  Increase by 5 repetitions every 3 days  Goal is 3 sets of 30 repetitions  Do with both knee straight and knee at 20 degrees of flexion  Try exercises on handout for back and knee as well.   Ice baths for feet 2 times a day for 10 minutes.   Wear heel lifts in shoes.   Take tylenol 650 mg three times a day is the best evidence based medicine we have for arthritis.  Glucosamine sulfate 750mg  twice a day is a supplement that has been shown to help moderate to severe arthritis. Capsaicin topically up to four times a day may also help with pain. Fish oil 3 grams daily Vitmain D 1000IU daily.  It's important that you continue to stay active. Controlling your weight is important.  Consider physical therapy to strengthen muscles around the joint that hurts to take pressure off of the joint itself. Shoe inserts with good arch support may be helpful. Heat or ice 20 minutes at a time 3-4 times a day as needed to help with pain. Water aerobics and cycling with low resistance are the best two types of exercise for arthritis. Come back and see me in 4 weeks.

## 2013-01-17 NOTE — Progress Notes (Signed)
   CC: Bilateral ankle pain,  back pain  HPI: Patient is a 47 year old gentleman with a past medical history significant for mental retardation (intelectually challenged).  Patient is coming in with bilateral ankle pain. Patient is no drainage or injury. Patient is accompanied with his father who is giving the past medical history and information. Patient has stated that the pain has been in the posterior aspect of the ankle bilaterally. Describes it as more of a dull aching sensation after a lot of activity. Has not tried anything including no change in shoes for over one year. Severity 7/10.  Patient is also complaining of back pain. Patient has had this pain intermittently for very long time. Patient does not remember any true injury. Patient has gained weight recently and has made this worse. Patient denies any radiation down the legs, any numbness, any bowel or bladder incontinence. Patient has tried over-the-counter anti-inflammatories before which has been helpful. With the severity of 7/10.  Past medical, surgical, family and social history reviewed. Medications reviewed all in the electronic medical record.   Review of Systems: No headache, visual changes, nausea, vomiting, diarrhea, constipation, dizziness, abdominal pain, skin rash, fevers, chills, night sweats, weight loss, swollen lymph nodes, body aches, joint swelling, muscle aches, chest pain, shortness of breath, mood changes.   Objective:    Blood pressure 134/74, pulse 76, SpO2 98.00%.   General: No apparent distress alert and oriented x3 mood and affect normal, dressed appropriately. Patient is deaf and uses sign language for communication HEENT: Pupils equal, extraocular movements intact Respiratory: Patient's speak in full sentences and does not appear short of breath Cardiovascular: No lower extremity edema, non tender, no erythema Skin: Warm dry intact with no signs of infection or rash on extremities or on axial  skeleton. Abdomen: Soft nontender Neuro: Cranial nerves II through XII are intact, neurovascularly intact in all extremities with 2+ DTRs and 2+ pulses. Lymph: No lymphadenopathy of posterior or anterior cervical chain or axillae bilaterally.  Gait normal with good balance and coordination.  MSK: Non tender with full range of motion and good stability and symmetric strength and tone of shoulders, elbows, wrist, hip, knee and ankles bilaterally.  Back Exam:  Inspection: Unremarkable  Motion: Flexion 35 deg, Extension 45 deg, Side Bending to 45 deg bilaterally,  Rotation to 45 deg bilaterally  SLR laying: Negative  XSLR laying: Negative  Palpable tenderness: None. FABER: negative. Sensory change: Gross sensation intact to all lumbar and sacral dermatomes.  Reflexes: 2+ at both patellar tendons, 2+ at achilles tendons, Babinski's downgoing.  Strength at foot  Plantar-flexion: 5/5 Dorsi-flexion: 5/5 Eversion: 5/5 Inversion: 5/5  Leg strength  Quad: 5/5 Hamstring: 5/5 Hip flexor: 5/5 Hip abductors: 5/5  Gait unremarkable.  Ankle: Bilateral Haglund's nodules bilaterally Range of motion is full in all directions. Strength is 5/5 in all directions. Stable lateral and medial ligaments; squeeze test and kleiger test unremarkable; Talar dome nontender; No pain at base of 5th MT; No tenderness over cuboid; No tenderness over N spot or navicular prominence No tenderness on posterior aspects of lateral and medial malleolus No sign of peroneal tendon subluxations or tenderness to palpation Negative tarsal tunnel tinel's Able to walk 4 steps. Mild tenderness to palpation over the Achilles insertion. Patient does have nodules bilaterally of the Achilles tendon approximately 4 cm from insertion.   Impression and Recommendations:     This case required medical decision making of moderate complexity.

## 2013-01-20 DIAGNOSIS — G4733 Obstructive sleep apnea (adult) (pediatric): Secondary | ICD-10-CM

## 2013-01-21 NOTE — Procedures (Signed)
NAME:  Daniel Summers, Daniel Summers NO.:  [id number]  MEDICAL RECORD NO.:  [id number]          PATIENT TYPE:  OUT  LOCATION:  SLEEP CENTER                 FACILITY[facility]  [facility]  PHYSICIAN:  Barbaraann Share, MD,FCCPDATE OF BIRTH:  June 03, 1965  DATE OF STUDY:  01/12/2013                           NOCTURNAL POLYSOMNOGRAM  REFERRING PHYSICIAN:  Barbaraann Share, MD,FCCP  REFERRING PHYSICIAN:  Barbaraann Share, MD,FCCP  INDICATION FOR STUDY:  Hypersomnia with sleep apnea.  EPWORTH SLEEPINESS SCORE:  14.  SLEEP ARCHITECTURE:  The patient had a total sleep time of 262 minutes with no slow-wave sleep and only 10 minutes of REM.  Sleep onset latency was normal at 25 minutes and REM onset was prolonged at 147 minutes. Sleep efficiency was poor at 60%.  RESPIRATORY DATA:  The patient was found to have 281 obstructive apneas and 103 obstructive hypopneas, giving him an apnea-hypopnea index of 88 events per hour.  The events occurred in all body positions, and there was moderate snoring noted throughout.  OXYGEN DATA:  There was O2 desaturation as low as 79% with the patient's obstructive events.  CARDIAC DATA:  Occasional PVC noted, but no clinically significant arrhythmias were seen.  MOVEMENTS/PARASOMNIA:  The patient had no significant leg jerks or other abnormal behaviors noted.  IMPRESSION/RECOMMENDATION: 1. Severe obstructive sleep apnea/hypopnea syndrome, with an AHI of 88     events per hour and oxygen desaturation as low as 69%.  Treatment     for this degree of sleep apnea should focus primarily on CPAP as     well as aggressive weight loss. 2. Occasional PVC noted, but no clinically significant arrhythmias     were seen.     Barbaraann Share, MD,FCCP Diplomate, American Board of Sleep Medicine   [facility]  D:  01/20/2013 15:51:43  T:  01/21/2013 02:06:11  Job:  [id number]

## 2013-01-24 ENCOUNTER — Telehealth: Payer: Self-pay | Admitting: *Deleted

## 2013-01-24 NOTE — Telephone Encounter (Signed)
Returning call can be reached at [phone].Daniel Summers

## 2013-01-24 NOTE — Telephone Encounter (Signed)
Father calling back.  [phone]

## 2013-01-24 NOTE — Telephone Encounter (Addendum)
Needs appt with KC asap to review sleep study. LMOM x 1 

## 2013-01-24 NOTE — Telephone Encounter (Signed)
Scheduled Nov 4 at 345p to review sleep study.

## 2013-01-31 ENCOUNTER — Encounter: Payer: Self-pay | Admitting: Pulmonary Disease

## 2013-01-31 ENCOUNTER — Ambulatory Visit (INDEPENDENT_AMBULATORY_CARE_PROVIDER_SITE_OTHER): Payer: 59 | Admitting: Pulmonary Disease

## 2013-01-31 VITALS — BP 116/68 | HR 95 | Temp 97.8°F | Ht 69.0 in | Wt 288.6 lb

## 2013-01-31 DIAGNOSIS — G4733 Obstructive sleep apnea (adult) (pediatric): Secondary | ICD-10-CM

## 2013-01-31 NOTE — Progress Notes (Signed)
  Subjective:    Patient ID: Daniel Summers, male    DOB: November 04, 1965, 47 y.o.   MRN: [id number]  HPI The patient comes in today for followup after his recent sleep study.  He was found to have severe OSA, with an AHI of 88 events per hour and oxygen desaturation as low as 69%.  I have reviewed the study with he and his father in detail, and answered questions.   Review of Systems  Constitutional: Negative for fever and unexpected weight change.  HENT: Negative for congestion, dental problem, ear pain, nosebleeds, postnasal drip, rhinorrhea, sinus pressure, sneezing, sore throat and trouble swallowing.   Eyes: Negative for redness and itching.  Respiratory: Positive for shortness of breath. Negative for cough, chest tightness and wheezing.   Cardiovascular: Positive for leg swelling ( ankles). Negative for palpitations.  Gastrointestinal: Negative for nausea and vomiting.  Genitourinary: Negative for dysuria.  Musculoskeletal: Negative for joint swelling.  Skin: Negative for rash.  Neurological: Negative for headaches.  Hematological: Does not bruise/bleed easily.  Psychiatric/Behavioral: Negative for dysphoric mood. The patient is not nervous/anxious.        Objective:   Physical Exam Overweight male in no acute distress Nose without purulence or discharge noted Neck without lymphadenopathy or thyromegaly Lower extremities without edema, no cyanosis Awake, but appears mildly sleepy, moves all 4 extremities.        Assessment & Plan:

## 2013-01-31 NOTE — Assessment & Plan Note (Signed)
The patient has severe obstructive sleep apnea by his recent sleep study, and will need to be started on CPAP.  We'll start him out at a moderate pressure level to allow for desensitization, and then will later optimize his pressure on the automatic setting.  I have also stressed to him the importance of aggressive weight reduction.

## 2013-01-31 NOTE — Patient Instructions (Signed)
Will start on cpap at a moderate pressure level.  Please call if having issues with tolerance Work on weight loss followup with me in 6weeks.  

## 2013-02-01 ENCOUNTER — Ambulatory Visit: Payer: 59 | Admitting: Pulmonary Disease

## 2013-03-02 ENCOUNTER — Ambulatory Visit: Payer: 59 | Admitting: Pulmonary Disease

## 2013-03-14 ENCOUNTER — Ambulatory Visit: Payer: 59 | Admitting: Pulmonary Disease

## 2013-03-17 ENCOUNTER — Encounter: Payer: Self-pay | Admitting: Pulmonary Disease

## 2013-05-06 ENCOUNTER — Encounter: Payer: 59 | Admitting: Internal Medicine

## 2013-07-18 ENCOUNTER — Emergency Department (INDEPENDENT_AMBULATORY_CARE_PROVIDER_SITE_OTHER): Payer: Medicare HMO

## 2013-07-18 ENCOUNTER — Encounter (HOSPITAL_COMMUNITY): Payer: Self-pay | Admitting: Emergency Medicine

## 2013-07-18 ENCOUNTER — Emergency Department (INDEPENDENT_AMBULATORY_CARE_PROVIDER_SITE_OTHER)
Admission: EM | Admit: 2013-07-18 | Discharge: 2013-07-18 | Disposition: A | Discharge status: Home / Self Care | Payer: Medicare HMO | Source: Home / Self Care | Attending: Family Medicine | Admitting: Family Medicine

## 2013-07-18 DIAGNOSIS — J189 Pneumonia, unspecified organism: Secondary | ICD-10-CM

## 2013-07-18 MED ORDER — MOXIFLOXACIN HCL 400 MG PO TABS: 400.0000 mg | ORAL_TABLET | Freq: Every day | ORAL | Status: DC

## 2013-07-18 MED ORDER — LIDOCAINE HCL (PF) 1 % IJ SOLN
INTRAMUSCULAR | Status: AC
  Filled 2013-07-18: qty 5

## 2013-07-18 MED ORDER — CEFTRIAXONE SODIUM 1 G IJ SOLR
1.0000 g | Freq: Once | INTRAMUSCULAR | Status: AC
  Administered 2013-07-18: 1 g via INTRAMUSCULAR

## 2013-07-18 MED ORDER — IPRATROPIUM-ALBUTEROL 0.5-2.5 (3) MG/3ML IN SOLN
3.0000 mL | Freq: Once | RESPIRATORY_TRACT | Status: AC
  Administered 2013-07-18: 3 mL via RESPIRATORY_TRACT

## 2013-07-18 MED ORDER — IPRATROPIUM-ALBUTEROL 0.5-2.5 (3) MG/3ML IN SOLN
RESPIRATORY_TRACT | Status: AC
  Filled 2013-07-18: qty 3

## 2013-07-18 MED ORDER — GUAIFENESIN-CODEINE 100-10 MG/5ML PO SOLN
10.0000 mL | ORAL | Status: DC | PRN
Start: 2013-07-18 — End: 2020-04-17

## 2013-07-18 MED ORDER — CEFTRIAXONE SODIUM 1 G IJ SOLR
INTRAMUSCULAR | Status: AC
  Filled 2013-07-18: qty 10

## 2013-07-18 NOTE — Discharge Instructions (Signed)
Pneumonia, Adult °Pneumonia is an infection of the lungs. It may be caused by a germ (virus or bacteria). Some types of pneumonia can spread easily from person to person. This can happen when you cough or sneeze. °HOME CARE °· Only take medicine as told by your doctor. °· Take your medicine (antibiotics) as told. Finish it even if you start to feel better. °· Do not smoke. °· You may use a vaporizer or humidifier in your room. This can help loosen thick spit (mucus). °· Sleep so you are almost sitting up (semi-upright). This helps reduce coughing. °· Rest. °A shot (vaccine) can help prevent pneumonia. Shots are often advised for: °· People over 65 years old. °· Patients on chemotherapy. °· People with long-term (chronic) lung problems. °· People with immune system problems. °GET HELP RIGHT AWAY IF:  °· You are getting worse. °· You cannot control your cough, and you are losing sleep. °· You cough up blood. °· Your pain gets worse, even with medicine. °· You have a fever. °· Any of your problems are getting worse, not better. °· You have shortness of breath or chest pain. °MAKE SURE YOU:  °· Understand these instructions. °· Will watch your condition. °· Will get help right away if you are not doing well or get worse. °Document Released: 09/03/2007 Document Revised: 06/09/2011 Document Reviewed: 06/07/2010 °[employer]® Patient Information ©2014 [employer]. ° °

## 2013-07-18 NOTE — ED Provider Notes (Signed)
CSN: [id number]     Arrival date & time 07/18/13  1607 History   None    Chief Complaint  Patient presents with  . URI   (Consider location/radiation/quality/duration/timing/severity/associated sxs/prior Treatment) HPI Comments: 48 year old male, deaf, with MR, is brought in for evaluation of productive cough, runny nose, for 4 days. The symptoms have been gradually worsening but they got a lot worse last night. He has a history of bronchitis, his caregiver believes that is what he has again. There is been no fever, chills, complaints of chest pain, or obvious shortness of breath. No vomiting or diarrhea.  Patient is a 48 y.o. male presenting with URI.  URI Presenting symptoms: congestion, cough and rhinorrhea     Past Medical History  Diagnosis Date  . Deaf   . [facility]  . Asthma   . Osteoarthritis     right ankle  . Mental retardation 05/02/2011  . Bilateral cataracts 05/02/2011   Past Surgical History  Procedure Laterality Date  . Hernia repair    . Inguinal hernia repair     Family History  Problem Relation Age of Onset  . Diabetes Father   . Coronary artery disease Father   . Heart disease Other   . Hypertension Other   . Diabetes Other   . Alcohol abuse Other    History  Substance Use Topics  . Smoking status: Never Smoker   . Smokeless tobacco: Never Used  . Alcohol Use: No    Review of Systems  Unable to perform ROS: Patient nonverbal  HENT: Positive for congestion and rhinorrhea.   Respiratory: Positive for cough. Negative for shortness of breath.   Cardiovascular: Negative for chest pain.  Gastrointestinal: Negative for vomiting and diarrhea.  All other systems reviewed and are negative.   Allergies  Review of patient's allergies indicates no known allergies.  Home Medications   Prior to Admission medications   Medication Sig Start Date End Date Taking? Authorizing Provider  atorvastatin (LIPITOR) 20 MG tablet Take  1 tablet (20 mg total) by mouth daily. 05/05/12 05/05/13  Corwin LevinsJames W John, MD  glucosamine-chondroitin 500-400 MG tablet Take 1 tablet by mouth 2 (two) times daily.    Historical Provider, MD  meloxicam (MOBIC) 15 MG tablet Take 1 tablet (15 mg total) by mouth daily. For 10 days then as needed. 01/17/13   Judi SaaZachary M Smith, DO  omeprazole (PRILOSEC) 20 MG capsule Take 2 capsules (40 mg total) by mouth daily. 12/02/11 01/31/13  Corwin LevinsJames W John, MD  traMADol (ULTRAM) 50 MG tablet Take 1 tablet (50 mg total) by mouth every 6 (six) hours as needed for pain. 05/05/12   Corwin LevinsJames W John, MD   BP 144/84  Pulse 107  Temp(Src) 97.8 F (36.6 C) (Oral)  Resp 15  SpO2 96% Physical Exam  Nursing note and vitals reviewed. Constitutional: He is oriented to person, place, and time. He appears well-developed and well-nourished. No distress.  HENT:  Head: Normocephalic and atraumatic.  Right Ear: External ear normal.  Left Ear: External ear normal.  Mouth/Throat: No oropharyngeal exudate.  Eyes: Conjunctivae are normal. Right eye exhibits no discharge. Left eye exhibits no discharge.  Neck: Normal range of motion. Neck supple.  Cardiovascular: Regular rhythm, normal heart sounds and normal pulses.  Tachycardia present.   Pulmonary/Chest: Effort normal. Not tachypneic. No respiratory distress. He has no wheezes. He has rhonchi (diffuse). He has no rales.  Lymphadenopathy:    He  has no cervical adenopathy.  Neurological: He is alert and oriented to person, place, and time. Coordination normal.  Skin: Skin is warm and dry. No rash noted. He is not diaphoretic.  Psychiatric: He has a normal mood and affect. Judgment normal.    ED Course  Procedures (including critical care time) Labs Review Labs Reviewed - No data to display   Imaging Review Dg Chest 2 View  07/18/2013   CLINICAL DATA:  Cough  EXAM: CHEST  2 VIEW  COMPARISON:  None.  FINDINGS: Upper limits normal heart size noted.  Peribronchial thickening is present.   Mild bibasilar opacities may represent atelectasis or pneumonia.  There is no evidence of pleural effusion, pneumothorax or pulmonary edema.  No acute bony abnormalities are identified.  IMPRESSION: Mild bibasilar opacities -question atelectasis versus pneumonia.  Peribronchial thickening.   Electronically Signed   By: Laveda AbbeJeff  Hu M.D.   On: 07/18/2013 18:22     MDM   1. CAP (community acquired pneumonia)    Chest x-ray reveals probable pneumonia. Giving Rocephin here and treated with Avelox and cough suppressant. Followup in 2 days for a recheck.  Meds ordered this encounter  Medications  . ipratropium-albuterol (DUONEB) 0.5-2.5 (3) MG/3ML nebulizer solution 3 mL    Sig:   . cefTRIAXone (ROCEPHIN) injection 1 g    Sig:   . moxifloxacin (AVELOX) 400 MG tablet    Sig: Take 1 tablet (400 mg total) by mouth daily at 8 pm.    Dispense:  7 tablet    Refill:  0    Order Specific Question:  Supervising Provider    Answer:  Linna HoffKINDL, JAMES D [phone][[id number]]  . guaiFENesin-codeine 100-10 MG/5ML syrup    Sig: Take 10 mLs by mouth every 4 (four) hours as needed for cough.    Dispense:  120 mL    Refill:  0    Order Specific Question:  Supervising Provider    Answer:  Bradd CanaryKINDL, JAMES D [[id number]]       Graylon GoodZachary H Yamilka Lopiccolo, PA-C 07/18/13 1836

## 2013-07-18 NOTE — ED Notes (Signed)
Mom brings pt in for cold sx onset 5 days Pt is deaf Sx include: productive cough, runny nose Denies f/v/n/d, SOB, wheezing Alert w/no signs of acute distress.

## 2013-07-20 ENCOUNTER — Emergency Department (INDEPENDENT_AMBULATORY_CARE_PROVIDER_SITE_OTHER)
Admission: EM | Admit: 2013-07-20 | Discharge: 2013-07-20 | Disposition: A | Discharge status: Home / Self Care | Payer: Medicare HMO | Source: Home / Self Care | Attending: Emergency Medicine | Admitting: Emergency Medicine

## 2013-07-20 ENCOUNTER — Encounter (HOSPITAL_COMMUNITY): Payer: Self-pay | Admitting: Emergency Medicine

## 2013-07-20 DIAGNOSIS — J189 Pneumonia, unspecified organism: Secondary | ICD-10-CM

## 2013-07-20 NOTE — Discharge Instructions (Signed)
Finish up all antibiotics.  Needs repeat chest x-ray in 1 month.

## 2013-07-20 NOTE — ED Provider Notes (Signed)
  Chief Complaint   Chief Complaint  Patient presents with  . Follow-up    History of Present Illness   Daniel Summers is a 48 year old male who is deaf and mentally retarded who presents today for followup on pneumonia. He was seen here 2 days ago with cough and fatigue. He did not have a fever. A chest x-ray revealed bibasilar opacities consistent with atelectasis versus pneumonia. He was treated for pneumonia with a DuoNeb breathing treatment, Rocephin IM followed by Avelox 400 mg per day. He returns today for scheduled followup in his mother states she's better he has some morning cough productive of yellow sputum but otherwise the cough seems to be improving. He's had absolutely no fever or he's eating well his activity level is back up to normal. He's not having breathing difficulties including no nasal congestion or rhinorrhea. He's had no vomiting or diarrhea.  Review of Systems   Other than as noted above, the patient denies any of the following symptoms: Systemic:  No fevers, chills, sweats, or myalgias. Eye:  No redness or discharge. ENT:  No ear pain, headache, nasal congestion, drainage, sinus pressure, or sore throat. Neck:  No neck pain, stiffness, or swollen glands. Lungs:  No cough, sputum production, hemoptysis, wheezing, chest tightness, shortness of breath or chest pain. GI:  No abdominal pain, nausea, vomiting or diarrhea.  PMFSH   Past medical history, family history, social history, meds, and allergies were reviewed.   Physical exam   Vital signs:  BP 121/83  Pulse 102  Temp(Src) 97.9 F (36.6 C) (Oral)  Resp 20  SpO2 97% General:  Alert and oriented.  In no distress.  Skin warm and dry. Eye:  No conjunctival injection or drainage. Lids were normal. ENT:  TMs and canals were normal, without erythema or inflammation.  Nasal mucosa was clear and uncongested, without drainage.  Mucous membranes were moist.  Pharynx was clear with no exudate or drainage.  There  were no oral ulcerations or lesions. Neck:  Supple, no adenopathy, tenderness or mass. Lungs:  No respiratory distress.  Lungs were clear to auscultation, without wheezes, rales or rhonchi.  Breath sounds were clear and equal bilaterally.  Heart:  Regular rhythm, without gallops, murmers or rubs. Skin:  Clear, warm, and dry, without rash or lesions.   Assessment     The encounter diagnosis was Community acquired pneumonia.  Appears to be improving.  Plan    1.  Meds:  The following meds were prescribed:   New Prescriptions   No medications on file    2.  Patient Education/Counseling:  The patient was given appropriate handouts, self care instructions, and instructed in symptomatic relief.  Instructed to get extra fluids, rest, and use a cool mist vaporizer.  Discussed with her mother need to finish up his antibiotics and return again in one month either here or to his primary care physician for a followup chest x-ray.  3.  Follow up:  The patient was told to follow up here if no better in 3 to 4 days, or sooner if becoming worse in any way, and given some red flag symptoms such as increasing fever, difficulty breathing, chest pain, or persistent vomiting which would prompt immediate return.  Follow up here as needed.      Reuben Likesavid C [place], MD 07/20/13 [phone]

## 2013-07-20 NOTE — ED Notes (Signed)
Pt here for follow up of pneumonia.  States "feeling a lot better".   Pt voices no concerns at this time.

## 2013-07-22 NOTE — ED Provider Notes (Signed)
Medical screening examination/treatment/procedure(s) were performed by resident physician or non-physician practitioner and as supervising physician I was immediately available for consultation/collaboration.   Barkley BrunsKINDL,JAMES DOUGLAS MD.   Linna HoffJames D Kindl, MD 07/22/13 1009

## 2014-08-10 DIAGNOSIS — G4733 Obstructive sleep apnea (adult) (pediatric): Secondary | ICD-10-CM | POA: Diagnosis not present

## 2015-02-12 DIAGNOSIS — G4733 Obstructive sleep apnea (adult) (pediatric): Secondary | ICD-10-CM | POA: Diagnosis not present

## 2015-03-08 DIAGNOSIS — H524 Presbyopia: Secondary | ICD-10-CM | POA: Diagnosis not present

## 2015-03-08 DIAGNOSIS — H5210 Myopia, unspecified eye: Secondary | ICD-10-CM | POA: Diagnosis not present

## 2015-08-13 DIAGNOSIS — G4733 Obstructive sleep apnea (adult) (pediatric): Secondary | ICD-10-CM | POA: Diagnosis not present

## 2016-02-14 DIAGNOSIS — G4733 Obstructive sleep apnea (adult) (pediatric): Secondary | ICD-10-CM | POA: Diagnosis not present

## 2016-08-13 DIAGNOSIS — G4733 Obstructive sleep apnea (adult) (pediatric): Secondary | ICD-10-CM | POA: Diagnosis not present

## 2017-02-15 DIAGNOSIS — G4733 Obstructive sleep apnea (adult) (pediatric): Secondary | ICD-10-CM | POA: Diagnosis not present

## 2017-08-19 DIAGNOSIS — G4733 Obstructive sleep apnea (adult) (pediatric): Secondary | ICD-10-CM | POA: Diagnosis not present

## 2018-02-19 DIAGNOSIS — G4733 Obstructive sleep apnea (adult) (pediatric): Secondary | ICD-10-CM | POA: Diagnosis not present

## 2018-08-20 DIAGNOSIS — G4733 Obstructive sleep apnea (adult) (pediatric): Secondary | ICD-10-CM | POA: Diagnosis not present

## 2019-03-08 DIAGNOSIS — Z20828 Contact with and (suspected) exposure to other viral communicable diseases: Secondary | ICD-10-CM | POA: Diagnosis not present

## 2019-05-26 DIAGNOSIS — Z961 Presence of intraocular lens: Secondary | ICD-10-CM | POA: Diagnosis not present

## 2019-05-26 DIAGNOSIS — H26493 Other secondary cataract, bilateral: Secondary | ICD-10-CM | POA: Diagnosis not present

## 2019-05-26 DIAGNOSIS — H538 Other visual disturbances: Secondary | ICD-10-CM | POA: Diagnosis not present

## 2019-05-26 DIAGNOSIS — H26492 Other secondary cataract, left eye: Secondary | ICD-10-CM | POA: Diagnosis not present

## 2019-06-06 DIAGNOSIS — H26491 Other secondary cataract, right eye: Secondary | ICD-10-CM | POA: Diagnosis not present

## 2019-06-13 DIAGNOSIS — L03317 Cellulitis of buttock: Secondary | ICD-10-CM | POA: Diagnosis not present

## 2019-06-13 DIAGNOSIS — L309 Dermatitis, unspecified: Secondary | ICD-10-CM | POA: Diagnosis not present

## 2019-06-13 DIAGNOSIS — I87393 Chronic venous hypertension (idiopathic) with other complications of bilateral lower extremity: Secondary | ICD-10-CM | POA: Diagnosis not present

## 2019-06-13 DIAGNOSIS — Z20828 Contact with and (suspected) exposure to other viral communicable diseases: Secondary | ICD-10-CM | POA: Diagnosis not present

## 2019-06-13 DIAGNOSIS — B372 Candidiasis of skin and nail: Secondary | ICD-10-CM | POA: Diagnosis not present

## 2019-06-13 DIAGNOSIS — E139 Other specified diabetes mellitus without complications: Secondary | ICD-10-CM | POA: Diagnosis not present

## 2019-06-13 DIAGNOSIS — Z0000 Encounter for general adult medical examination without abnormal findings: Secondary | ICD-10-CM | POA: Diagnosis not present

## 2019-06-13 DIAGNOSIS — N400 Enlarged prostate without lower urinary tract symptoms: Secondary | ICD-10-CM | POA: Diagnosis not present

## 2019-06-13 DIAGNOSIS — E785 Hyperlipidemia, unspecified: Secondary | ICD-10-CM | POA: Diagnosis not present

## 2019-08-10 DIAGNOSIS — G4733 Obstructive sleep apnea (adult) (pediatric): Secondary | ICD-10-CM | POA: Diagnosis not present

## 2019-12-25 DIAGNOSIS — Z20822 Contact with and (suspected) exposure to covid-19: Secondary | ICD-10-CM | POA: Diagnosis not present

## 2019-12-25 DIAGNOSIS — J209 Acute bronchitis, unspecified: Secondary | ICD-10-CM | POA: Diagnosis not present

## 2020-01-05 DIAGNOSIS — J9801 Acute bronchospasm: Secondary | ICD-10-CM | POA: Diagnosis not present

## 2020-01-05 DIAGNOSIS — J209 Acute bronchitis, unspecified: Secondary | ICD-10-CM | POA: Diagnosis not present

## 2020-01-05 DIAGNOSIS — Z6841 Body Mass Index (BMI) 40.0 and over, adult: Secondary | ICD-10-CM | POA: Diagnosis not present

## 2020-01-05 DIAGNOSIS — E785 Hyperlipidemia, unspecified: Secondary | ICD-10-CM | POA: Diagnosis not present

## 2020-01-05 DIAGNOSIS — Z20822 Contact with and (suspected) exposure to covid-19: Secondary | ICD-10-CM | POA: Diagnosis not present

## 2020-02-13 DIAGNOSIS — G4733 Obstructive sleep apnea (adult) (pediatric): Secondary | ICD-10-CM | POA: Diagnosis not present

## 2020-03-15 ENCOUNTER — Other Ambulatory Visit: Payer: Self-pay

## 2020-03-15 ENCOUNTER — Encounter (HOSPITAL_BASED_OUTPATIENT_CLINIC_OR_DEPARTMENT_OTHER): Payer: Self-pay | Admitting: Emergency Medicine

## 2020-03-15 ENCOUNTER — Emergency Department (HOSPITAL_BASED_OUTPATIENT_CLINIC_OR_DEPARTMENT_OTHER)
Admission: EM | Admit: 2020-03-15 | Discharge: 2020-03-15 | Disposition: A | Discharge status: Home / Self Care | Payer: Medicare HMO | Attending: Emergency Medicine | Admitting: Emergency Medicine

## 2020-03-15 DIAGNOSIS — F840 Autistic disorder: Secondary | ICD-10-CM | POA: Insufficient documentation

## 2020-03-15 DIAGNOSIS — H9190 Unspecified hearing loss, unspecified ear: Secondary | ICD-10-CM | POA: Insufficient documentation

## 2020-03-15 DIAGNOSIS — R000 Tachycardia, unspecified: Secondary | ICD-10-CM | POA: Diagnosis present

## 2020-03-15 DIAGNOSIS — I471 Supraventricular tachycardia: Secondary | ICD-10-CM | POA: Diagnosis not present

## 2020-03-15 DIAGNOSIS — J45909 Unspecified asthma, uncomplicated: Secondary | ICD-10-CM | POA: Diagnosis not present

## 2020-03-15 LAB — CBC WITH DIFFERENTIAL/PLATELET
Abs Immature Granulocytes: 0.03 10*3/uL (ref 0.00–0.07)
Basophils Absolute: 0.1 10*3/uL (ref 0.0–0.1)
Basophils Relative: 1 %
Eosinophils Absolute: 0.4 10*3/uL (ref 0.0–0.5)
Eosinophils Relative: 5 %
HCT: 49.6 % (ref 39.0–52.0)
Hemoglobin: 16.5 g/dL (ref 13.0–17.0)
Immature Granulocytes: 0 %
Lymphocytes Relative: 20 %
Lymphs Abs: 1.6 10*3/uL (ref 0.7–4.0)
MCH: 31.4 pg (ref 26.0–34.0)
MCHC: 33.3 g/dL (ref 30.0–36.0)
MCV: 94.5 fL (ref 80.0–100.0)
Monocytes Absolute: 1 10*3/uL (ref 0.1–1.0)
Monocytes Relative: 13 %
Neutro Abs: 4.9 10*3/uL (ref 1.7–7.7)
Neutrophils Relative %: 61 %
Platelets: 338 10*3/uL (ref 150–400)
RBC: 5.25 MIL/uL (ref 4.22–5.81)
RDW: 12.8 % (ref 11.5–15.5)
WBC: 7.9 10*3/uL (ref 4.0–10.5)
nRBC: 0 % (ref 0.0–0.2)

## 2020-03-15 LAB — BASIC METABOLIC PANEL
Anion gap: 12 (ref 5–15)
BUN: 15 mg/dL (ref 6–20)
CO2: 24 mmol/L (ref 22–32)
Calcium: 8.9 mg/dL (ref 8.9–10.3)
Chloride: 103 mmol/L (ref 98–111)
Creatinine, Ser: 0.9 mg/dL (ref 0.61–1.24)
GFR, Estimated: >60 mL/min (ref >60)
Glucose, Bld: 95 mg/dL (ref 70–99)
Potassium: 3.9 mmol/L (ref 3.5–5.1)
Sodium: 139 mmol/L (ref 135–145)

## 2020-03-15 LAB — TSH: TSH: 5.202 u[IU]/mL — ABNORMAL HIGH (ref 0.350–4.500)

## 2020-03-15 LAB — TROPONIN I (HIGH SENSITIVITY)
Troponin I (High Sensitivity): 5 ng/L (ref <18)
Troponin I (High Sensitivity): 11 ng/L (ref <18)

## 2020-03-15 MED ORDER — ADENOSINE 6 MG/2ML IV SOLN
INTRAVENOUS | Status: AC
  Administered 2020-03-15: 6 mg
  Filled 2020-03-15: qty 4

## 2020-03-15 NOTE — ED Triage Notes (Signed)
Patient presents with his sister with complaints of rapid heart rate; states noted HR to be 220 on pulse ox at home.

## 2020-03-15 NOTE — ED Provider Notes (Signed)
  Physical Exam  BP 105/70 (BP Location: Right Arm)   Pulse 76   Temp 97.6 F (36.4 C) (Oral)   Resp 12   Ht 5\' 9"  (1.753 m)   Wt 130.9 kg   SpO2 100%   BMI 42.62 kg/m   Physical Exam  ED Course/Procedures     Procedures  MDM   I assumed care of the patient from the night team.  Patient had come in with PSVT and is cardioverted.  Family history of it.  Patient has never had this before.  If the troponin is negative patient will be stable for discharge.  9:02 AM Reassessed the patient around 8:00 with the family.  TSH was pending at that time.  It is still pending.  I do not think patient needs to stay in the hospital, doubt that this is thyroid storm.  Will discharge.  Advised PCP follow-up in 1 week.     , MD 03/15/20 [phone]

## 2020-03-15 NOTE — ED Provider Notes (Signed)
[facility] Provider Note: [name], MD, FACEP  CSN: [id number] MRN: [id number] ARRIVAL: 03/15/20 at 0629 ROOM: [facility]   CHIEF COMPLAINT  Tachycardia  Level 5 caveat: Autism; deafness HISTORY OF PRESENT ILLNESS  03/15/20 6:49 AM Daniel Summers is a 54 y.o. male with autism and with autism and deafness.  His sister, who is his historian and caretaker, states he awakened her just prior to arrival saying his heart did not feel right.  His home pulse oximeter showed a heart rate as high as 220 and on arrival here his heart rate is 179.  It is unclear if he is having any pain.  Nothing is known to make his symptoms better or worse.  She the sister herself has a history of supraventricular tachycardia and has had an ablation in the past.   Past Medical History:  Diagnosis Date   Asthma    Bilateral cataracts 05/02/2011   Deaf    History of blood transfusion    At Birth   Mental retardation 05/02/2011   Osteoarthritis    right ankle    Past Surgical History:  Procedure Laterality Date   EYE SURGERY     HERNIA REPAIR     INGUINAL HERNIA REPAIR      Family History  Problem Relation Age of Onset   Diabetes Father    Coronary artery disease Father    Heart disease Other    Hypertension Other    Diabetes Other    Alcohol abuse Other     Social History   Tobacco Use   Smoking status: Never Smoker   Smokeless tobacco: Never Used  Substance Use Topics   Alcohol use: No   Drug use: No    Prior to Admission medications   Medication Sig Start Date End Date Taking? Authorizing Provider  atorvastatin (LIPITOR) 20 MG tablet Take 1 tablet (20 mg total) by mouth daily. 05/05/12 05/05/13  Corwin Levins, MD  glucosamine-chondroitin 500-400 MG tablet Take 1 tablet by mouth 2 (two) times daily.    [provider]  guaiFENesin-codeine 100-10 MG/5ML syrup Take 10 mLs by mouth every 4 (four) hours as needed for cough. 07/18/13   Excell Seltzer, Adrian Blackwater, PA-C  meloxicam (MOBIC) 15 MG tablet Take 1 tablet (15 mg total) by mouth daily. For 10 days then as needed. 01/17/13   Judi Saa, DO  moxifloxacin (AVELOX) 400 MG tablet Take 1 tablet (400 mg total) by mouth daily at 8 pm. 07/18/13   Excell Seltzer, Adrian Blackwater, PA-C  omeprazole (PRILOSEC) 20 MG capsule Take 2 capsules (40 mg total) by mouth daily. 12/02/11 01/31/13  Corwin Levins, MD  traMADol (ULTRAM) 50 MG tablet Take 1 tablet (50 mg total) by mouth every 6 (six) hours as needed for pain. 05/05/12   Corwin Levins, MD    Allergies Patient has no known allergies.   REVIEW OF SYSTEMS  Negative except as noted here or in the History of Present Illness.   PHYSICAL EXAMINATION  Initial Vital Signs Blood pressure 120/72, pulse (!) 179, temperature 97.6 F (36.4 C), temperature source Oral, resp. rate 16, SpO2 98 %.  Examination General: Well-developed, well-nourished male in no acute distress; appearance consistent with age of record HENT: normocephalic; atraumatic Eyes: pupils equal, round and reactive to light; extraocular muscles intact; bilateral pseudophakia Neck: supple Heart: Tachycardia; supraventricular tachycardia on monitor Lungs: clear to auscultation bilaterally Abdomen: soft; nondistended; nontender; bowel sounds present Extremities: No deformity; full range of  motion; pulses normal Neurologic: Awake, alert; motor function intact in all extremities and symmetric; no facial droop Skin: Warm and dry Psychiatric: Flat affect   RESULTS  Summary of this visit's results, reviewed and interpreted by myself:   EKG Interpretation  Date/Time:  Thursday March 15 2020 06:40:38 EST Ventricular Rate:  172 PR Interval:    QRS Duration: 104 QT Interval:  273 QTC Calculation: 462 R Axis:   101 Text Interpretation: Supraventricular tachycardia Probable RVH w/ secondary repol abnormality ST depression, probably rate related No previous ECGs available Confirmed by Paula Libra  [phone]) on 03/15/2020 6:49:31 AM      Laboratory Studies: No results found for this or any previous visit (from the past 24 hour(s)). Imaging Studies: No results found.  ED COURSE and MDM  Nursing notes, initial and subsequent vitals signs, including pulse oximetry, reviewed and interpreted by myself.  Vitals:   03/15/20 0647 03/15/20 0650  BP: 120/72   Pulse: (!) 179   Resp: 16   Temp: 97.6 F (36.4 C)   TempSrc: Oral   SpO2: 98%   Weight:  130.9 kg  Height:  5\' 9"  (1.753 m)   Medications  adenosine (ADENOCARD) 6 MG/2ML injection (6 mg  Given 03/15/20 0647)   6:59 AM Successful chemical cardioversion.  Patient in normal sinus rhythm.  PROCEDURES  .Cardioversion  Date/Time: 03/15/2020 6:52 AM Performed by: Secilia Apps, MD Authorized by: Kaysan Peixoto, MD   Consent:    Consent obtained:  Verbal   Consent given by:  Guardian Pre-procedure details:    Cardioversion basis:  Emergent   Rhythm:  Supraventricular tachycardia Patient sedated: No Attempt one:    Cardioversion mode attempt one: IV adenosine.   Shock outcome:  Conversion to normal sinus rhythm Post-procedure details:    Patient status:  Awake   Patient tolerance of procedure:  Tolerated well, no immediate complications   ED DIAGNOSES     ICD-10-CM   1. SVT (supraventricular tachycardia) (HCC)  I47.1        Berta Denson, MD 03/15/20 [phone]

## 2020-03-15 NOTE — Discharge Instructions (Addendum)
Please follow-up with the primary care doctor in 1 week. Read the instructions provided on SVT.

## 2020-04-09 DIAGNOSIS — J019 Acute sinusitis, unspecified: Secondary | ICD-10-CM | POA: Diagnosis not present

## 2020-04-09 DIAGNOSIS — R0981 Nasal congestion: Secondary | ICD-10-CM | POA: Diagnosis not present

## 2020-04-09 DIAGNOSIS — Z20822 Contact with and (suspected) exposure to covid-19: Secondary | ICD-10-CM | POA: Diagnosis not present

## 2020-04-09 DIAGNOSIS — J209 Acute bronchitis, unspecified: Secondary | ICD-10-CM | POA: Diagnosis not present

## 2020-04-12 ENCOUNTER — Other Ambulatory Visit: Payer: Self-pay

## 2020-04-12 ENCOUNTER — Encounter (HOSPITAL_BASED_OUTPATIENT_CLINIC_OR_DEPARTMENT_OTHER): Payer: Self-pay

## 2020-04-12 ENCOUNTER — Emergency Department (HOSPITAL_BASED_OUTPATIENT_CLINIC_OR_DEPARTMENT_OTHER)
Admission: EM | Admit: 2020-04-12 | Discharge: 2020-04-12 | Disposition: A | Discharge status: Home / Self Care | Payer: Medicare HMO | Attending: Emergency Medicine | Admitting: Emergency Medicine

## 2020-04-12 ENCOUNTER — Emergency Department (HOSPITAL_BASED_OUTPATIENT_CLINIC_OR_DEPARTMENT_OTHER): Payer: Medicare HMO

## 2020-04-12 DIAGNOSIS — Z20822 Contact with and (suspected) exposure to covid-19: Secondary | ICD-10-CM | POA: Diagnosis not present

## 2020-04-12 DIAGNOSIS — F840 Autistic disorder: Secondary | ICD-10-CM | POA: Diagnosis not present

## 2020-04-12 DIAGNOSIS — J069 Acute upper respiratory infection, unspecified: Secondary | ICD-10-CM | POA: Diagnosis not present

## 2020-04-12 DIAGNOSIS — I471 Supraventricular tachycardia: Secondary | ICD-10-CM | POA: Diagnosis not present

## 2020-04-12 DIAGNOSIS — J45909 Unspecified asthma, uncomplicated: Secondary | ICD-10-CM | POA: Diagnosis not present

## 2020-04-12 DIAGNOSIS — Z7951 Long term (current) use of inhaled steroids: Secondary | ICD-10-CM | POA: Diagnosis not present

## 2020-04-12 DIAGNOSIS — R079 Chest pain, unspecified: Secondary | ICD-10-CM | POA: Diagnosis not present

## 2020-04-12 HISTORY — DX: Autistic disorder: F84.0

## 2020-04-12 LAB — CBC
HCT: 52.9 % — ABNORMAL HIGH (ref 39.0–52.0)
Hemoglobin: 17.9 g/dL — ABNORMAL HIGH (ref 13.0–17.0)
MCH: 31.5 pg (ref 26.0–34.0)
MCHC: 33.8 g/dL (ref 30.0–36.0)
MCV: 93 fL (ref 80.0–100.0)
Platelets: 293 10*3/uL (ref 150–400)
RBC: 5.69 MIL/uL (ref 4.22–5.81)
RDW: 12.8 % (ref 11.5–15.5)
WBC: 9.2 10*3/uL (ref 4.0–10.5)
nRBC: 0 % (ref 0.0–0.2)

## 2020-04-12 LAB — BASIC METABOLIC PANEL
Anion gap: 12 (ref 5–15)
BUN: 19 mg/dL (ref 6–20)
CO2: 24 mmol/L (ref 22–32)
Calcium: 8.9 mg/dL (ref 8.9–10.3)
Chloride: 103 mmol/L (ref 98–111)
Creatinine, Ser: 1.13 mg/dL (ref 0.61–1.24)
GFR, Estimated: >60 mL/min (ref >60)
Glucose, Bld: 88 mg/dL (ref 70–99)
Potassium: 3.3 mmol/L — ABNORMAL LOW (ref 3.5–5.1)
Sodium: 139 mmol/L (ref 135–145)

## 2020-04-12 LAB — TROPONIN I (HIGH SENSITIVITY)
Troponin I (High Sensitivity): 8 ng/L (ref <18)
Troponin I (High Sensitivity): 8 ng/L (ref <18)

## 2020-04-12 MED ORDER — ADENOSINE 6 MG/2ML IV SOLN
INTRAVENOUS | Status: AC
  Administered 2020-04-12: 12 mg
  Filled 2020-04-12: qty 4

## 2020-04-12 NOTE — ED Notes (Signed)
PCXR at bedside.

## 2020-04-12 NOTE — Discharge Instructions (Addendum)
You can take 600 mg of ibuprofen every 6 hours, you can take 1000 mg of Tylenol every 6 hours, you can alternate these every 3 or you can take them together.  For fevers body aches and chills.  You can try honey for cough suppression.  For episodes of SVT you can try bearing down for at least 10 seconds, then elevate the legs.  Another thing you can do is get a Ziploc bag full of ice get very wet and cold and then apply it to the face for few seconds or the groin for few seconds.  This is called a dive reflex and can also have a similar effect.  If you cannot get the symptoms under control reports a local emergency department for care.  The Covid test is pending at time of discharge.  Instructions on how to follow this up on my chart are on your discharge paperwork, you can also call the department if you are having trouble finding these results.  If he/she is Covid positive he/she will need to be quarantine for total 5 days since the onset of symptoms +24 hours of no fever and resolving symptoms, additionally he/she needs to wear a mask near all others for 5 more days. If he/she is not Covid positive he/she is able to go back to normal day-to-day routine as long as he/she is not having fevers and it has been 24 hours since his/her last fever.

## 2020-04-12 NOTE — ED Notes (Signed)
RT was present for adenosine administrations. Placed patient on 2L with ETCO2, readings were 32-35. Patient tolerated procedure well.

## 2020-04-12 NOTE — ED Notes (Signed)
ED Provider at bedside. Attempted to decrease heart rate by coughing and laying down with legs up

## 2020-04-12 NOTE — ED Triage Notes (Signed)
Pt deaf and Autistic. Pt's sister/POA at triage and reports that the Pt started having CP approximately 30 mins PTA. Last time Pt was seen in the ED was for SVT. Pt has had existing cough/URI that he has tested negative

## 2020-04-12 NOTE — ED Notes (Signed)
Pt s sister is very happy with care , understands all d/c instructions and is aware of cards appointment  Upcoming , pt assis ted up to BR to void

## 2020-04-12 NOTE — ED Notes (Signed)
Pt on cardiac pads and monitor reports SVT 180. RT at bedside .

## 2020-04-12 NOTE — ED Provider Notes (Signed)
[facility] EMERGENCY DEPARTMENT Provider Note   CSN: [id number] Arrival date & time: 04/12/20  1436     History Chief Complaint  Patient presents with  . Chest Pain    Daniel Summers is a 55 y.o. male.   Chest Pain Pain location:  Unable to specify Pain quality: pressure   Timing:  Intermittent Progression:  Waxing and waning Chronicity:  Recurrent Context comment:  Hx of SVT and fam hx Relieved by: vagal manuevers. Worsened by:  Nothing Ineffective treatments:  None tried Associated symptoms: cough   Associated symptoms: no back pain, no fever, no headache, no nausea, no palpitations, no shortness of breath and no vomiting        Past Medical History:  Diagnosis Date  . Asthma   . Autistic disorder of childhood onset   . Bilateral cataracts 05/02/2011  . Deaf   . History of blood transfusion    At [facility]  . Mental retardation 05/02/2011  . Osteoarthritis    right ankle    Patient Active Problem List   Diagnosis Date Noted  . Achilles tendinosis 01/17/2013  . OSA (obstructive sleep apnea) 12/08/2012  . Acute bronchitis 07/28/2012  . Unspecified asthma, with exacerbation 07/28/2012  . Pre-ulcerative corn or callous 05/05/2012  . Right shoulder pain 12/02/2011  . Low back pain 12/02/2011  . Bilateral ankle pain 12/02/2011  . Mental retardation 05/02/2011  . Bilateral cataracts 05/02/2011  . Asthma   . Osteoarthritis   . Preventative health care 04/27/2011  . Deaf     Past Surgical History:  Procedure Laterality Date  . EYE SURGERY    . HERNIA REPAIR    . INGUINAL HERNIA REPAIR         Family History  Problem Relation Age of Onset  . Diabetes Father   . Coronary artery disease Father   . Heart disease Other   . Hypertension Other   . Diabetes Other   . Alcohol abuse Other     Social History   Tobacco Use  . Smoking status: Never Smoker  . Smokeless tobacco: Never Used  Substance Use Topics  . Alcohol use: No  . Drug use: No     Home Medications Prior to Admission medications   Medication Sig Start Date End Date Taking? Authorizing Provider  albuterol (VENTOLIN HFA) 108 (90 Base) MCG/ACT inhaler SMARTSIG:2 Puff(s) By Mouth 4 Times Daily 01/29/20   [provider]  amoxicillin-clavulanate (AUGMENTIN) 875-125 MG tablet Take 1 tablet by mouth 2 (two) times daily. 01/06/20   [provider]  atorvastatin (LIPITOR) 20 MG tablet Take 1 tablet (20 mg total) by mouth daily. 05/05/12 05/05/13  Corwin Levins, MD  glucosamine-chondroitin 500-400 MG tablet Take 1 tablet by mouth 2 (two) times daily.    [provider]  guaiFENesin-codeine 100-10 MG/5ML syrup Take 10 mLs by mouth every 4 (four) hours as needed for cough. 07/18/13   Excell Seltzer, Adrian Blackwater, PA-C  meloxicam (MOBIC) 15 MG tablet Take 1 tablet (15 mg total) by mouth daily. For 10 days then as needed. 01/17/13   Judi Saa, DO  moxifloxacin (AVELOX) 400 MG tablet Take 1 tablet (400 mg total) by mouth daily at 8 pm. 07/18/13   Excell Seltzer, Adrian Blackwater, PA-C  omeprazole (PRILOSEC) 20 MG capsule Take 2 capsules (40 mg total) by mouth daily. 12/02/11 01/31/13  Corwin Levins, MD  traMADol (ULTRAM) 50 MG tablet Take 1 tablet (50 mg total) by mouth every 6 (six) hours  as needed for pain. 05/05/12   Corwin Levins, MD    Allergies    Patient has no known allergies.  Review of Systems   Review of Systems  Constitutional: Negative for chills and fever.  HENT: Positive for congestion and rhinorrhea.   Respiratory: Positive for cough. Negative for shortness of breath.   Cardiovascular: Positive for chest pain. Negative for palpitations.  Gastrointestinal: Negative for diarrhea, nausea and vomiting.  Genitourinary: Negative for difficulty urinating and dysuria.  Musculoskeletal: Negative for arthralgias and back pain.  Skin: Negative for color change and rash.  Neurological: Negative for light-headedness and headaches.    Physical Exam Updated Vital Signs BP  118/61   Pulse 69   Temp 99 F (37.2 C) (Oral)   Resp 16   Ht 5\' 9"  (1.753 m)   Wt 130.9 kg   SpO2 97%   BMI 42.62 kg/m   Physical Exam Vitals and nursing note reviewed.  Constitutional:      General: He is not in acute distress.    Appearance: Normal appearance.  HENT:     Head: Normocephalic and atraumatic.     Nose: No rhinorrhea.  Eyes:     General:        Right eye: No discharge.        Left eye: No discharge.     Conjunctiva/sclera: Conjunctivae normal.  Cardiovascular:     Rate and Rhythm: Regular rhythm. Tachycardia present.     Heart sounds:   No systolic murmur is present. No friction rub. No gallop.   Pulmonary:     Effort: Pulmonary effort is normal. No tachypnea, accessory muscle usage or respiratory distress.     Breath sounds: No stridor. Wheezing present.  Chest:     Chest wall: No tenderness.  Abdominal:     General: Abdomen is flat. There is no distension.     Palpations: Abdomen is soft.  Musculoskeletal:        General: No deformity or signs of injury.     Right lower leg: No edema.     Left lower leg: No edema.  Skin:    General: Skin is warm and dry.  Neurological:     General: No focal deficit present.     Mental Status: He is alert. Mental status is at baseline.     Motor: No weakness.  Psychiatric:        Mood and Affect: Mood normal.        Behavior: Behavior normal.        Thought Content: Thought content normal.     ED Results / Procedures / Treatments   Labs (all labs ordered are listed, but only abnormal results are displayed) Labs Reviewed  BASIC METABOLIC PANEL - Abnormal; Notable for the following components:      Result Value   Potassium 3.3 (*)    All other components within normal limits  CBC - Abnormal; Notable for the following components:   Hemoglobin 17.9 (*)    HCT 52.9 (*)    All other components within normal limits  SARS CORONAVIRUS 2 (TAT 6-24 HRS)  TROPONIN I (HIGH SENSITIVITY)  TROPONIN I (HIGH  SENSITIVITY)    EKG EKG Interpretation  Date/Time:  Thursday April 12 2020 15:04:38 EST Ventricular Rate:  102 PR Interval:    QRS Duration: 114 QT Interval:  320 QTC Calculation: 417 R Axis:   84 Text Interpretation: Sinus tachycardia Probable left atrial enlargement Incomplete right bundle branch block Probable  anterolateral infarct, old Confirmed by Cherlynn Perches ([id number]) on 04/12/2020 3:22:34 PM   Radiology DG Chest Portable 1 View  Result Date: 04/12/2020 CLINICAL DATA:  Chest pain. EXAM: PORTABLE CHEST 1 VIEW COMPARISON:  July 18, 2013. FINDINGS: Stable cardiomediastinal silhouette. No pneumothorax or pleural effusion is noted. Both lungs are clear. The visualized skeletal structures are unremarkable. IMPRESSION: No active disease. Electronically Signed   By: Lupita Raider M.D.   On: 04/12/2020 15:29    Procedures .Cardioversion  Date/Time: 04/12/2020 3:18 PM Performed by: Sabino Donovan, MD Authorized by: Sabino Donovan, MD   Consent:    Consent obtained:  Verbal   Consent given by:  Guardian   Risks discussed:  Cutaneous burn, death, induced arrhythmia and pain   Alternatives discussed:  No treatment Pre-procedure details:    Cardioversion basis:  Emergent   Rhythm:  Supraventricular tachycardia Attempt one:    Cardioversion mode attempt one: adenosine 12mg .   Shock outcome:  Conversion to normal sinus rhythm Post-procedure details:    Patient status:  Awake   Patient tolerance of procedure:  Tolerated well, no immediate complications   (including critical care time)  Medications Ordered in ED Medications  adenosine (ADENOCARD) 6 MG/2ML injection (12 mg  Given 04/12/20 1503)    ED Course  I have reviewed the triage vital signs and the nursing notes.  Pertinent labs & imaging results that were available during my care of the patient were reviewed by me and considered in my medical decision making (see chart for details).    MDM Rules/Calculators/A&P                           History of SVT comes with the same.  Able to convert with adenosine.  Sinus rhythm on repeat EKG after adenosine 12 mg was given.  Patient tolerated well.  Spoke with cardiology and arrange follow-up.  They agree outpatient follow-up is appropriate either in person or virtual visit.  Labs pending.  Patient has had URI symptoms but is breathing comfortably, has diffuse wheezing however I have concerns with albuterol causing worsening arrhythmia.  We will let him rest comfortably breathe comfortably as he is.  We will swab him for COVID.  Patient is remained in sinus rhythm.  He is remained comfortable.  Patient's sister who is able to communicate with him due to his nonverbal status and being deaf, comments that he is intermittently solving some chest discomfort.  His troponin is negative x2.  Patient's sister feels that he is safe to go home with his diagnosis of bronchitis from an outside provider as well as return precautions and strategies for dealing with SVT.  I spoke to cardiologist on-call, they agree with the plan for discharge home outpatient follow-up.  They have an appointment already made.  This COVID test is pending at time of discharge with return precautions and quarantine precautions given.  Pt is safe for DC home with outpatient follow up. The patient and family agrees with the plan and has no other questions or concerns.   Final Clinical Impression(s) / ED Diagnoses Final diagnoses:  SVT (supraventricular tachycardia) (HCC)  Upper respiratory tract infection, unspecified type    Rx / DC Orders ED Discharge Orders    None       04/14/20, MD 04/12/20 1752

## 2020-04-12 NOTE — ED Notes (Signed)
After adenosine given NSR noted at 101 per Myrtis Ser, EDP

## 2020-04-13 LAB — SARS CORONAVIRUS 2 (TAT 6-24 HRS): SARS Coronavirus 2: NEGATIVE

## 2020-04-17 ENCOUNTER — Encounter: Payer: Self-pay | Admitting: Cardiology

## 2020-04-17 ENCOUNTER — Telehealth: Payer: Self-pay | Admitting: *Deleted

## 2020-04-17 ENCOUNTER — Other Ambulatory Visit: Payer: Self-pay

## 2020-04-17 ENCOUNTER — Telehealth (INDEPENDENT_AMBULATORY_CARE_PROVIDER_SITE_OTHER): Payer: Medicare HMO | Admitting: Cardiology

## 2020-04-17 VITALS — Ht 69.0 in | Wt 280.0 lb

## 2020-04-17 DIAGNOSIS — H9190 Unspecified hearing loss, unspecified ear: Secondary | ICD-10-CM | POA: Diagnosis not present

## 2020-04-17 DIAGNOSIS — F840 Autistic disorder: Secondary | ICD-10-CM

## 2020-04-17 DIAGNOSIS — I471 Supraventricular tachycardia: Secondary | ICD-10-CM | POA: Diagnosis not present

## 2020-04-17 NOTE — Telephone Encounter (Signed)
Spoke to caregiver, sister (pt deaf/autistic). Aware Dr. Lalla Brothers recommends echo and svt ablation after echo has been completed. Aware office will call to arrange echo. Aware Ancil Boozer, RN will call to arrange ablation. Aware Boneta Lucks will also give them contact number to find PCP that takes pt insurance. Sister is agreeable to plan.

## 2020-04-17 NOTE — Progress Notes (Signed)
Virtual Visit via Telephone Note   This visit type was conducted due to national recommendations for restrictions regarding the COVID-19 Pandemic (e.g. social distancing) in an effort to limit this patient's exposure and mitigate transmission in our community.  Due to his co-morbid illnesses, this patient is at least at moderate risk for complications without adequate follow up.  This format is felt to be most appropriate for this patient at this time.  The patient did not have access to video technology/had technical difficulties with video requiring transitioning to audio format only (telephone).  All issues noted in this document were discussed and addressed.  No physical exam could be performed with this format.  Please refer to the patient's chart for his  consent to telehealth for [facility].    Date:  04/17/2020   ID:  Daniel Summers, DOB [birth year], MRN [id number] The patient was identified using 2 identifiers.  Patient Location: Home Provider Location: Home Office  PCP:  Patient, No Pcp Per  Cardiologist:  No primary care provider on file.  Electrophysiologist:  Lanier Prude, MD   Evaluation Performed:  New Patient Evaluation  Chief Complaint:  SVT  History of Present Illness:    Daniel Summers is a 55 y.o. male with SVT.  Medical history is significant for deafness.  He was seen in the emergency department April 12, 2020 for SVT.  His SVT was treated successfully with 12 mg of adenosine.  He had previously been seen in the emergency department on March 15, 2020 for the same.  Today I spoke to his sister [name] who is his caretaker after both of their parents passed away.  His sister tells me that her brother is deaf and autistic but communicates very effectively with her.  He is able to communicate to her when something is abnormal about his health.  She tells me that during the episodes of SVT he is reported being "concerned".  They have a pulse oximeter at  home which he went to get when he felt the most recent episode.  He put it on his finger and noted the abnormal heart rate.  She tells me that prior to these 2 episodes in the past month he has not had an episode of SVT.  He has been having trouble with a cough that has been ongoing for about 4 weeks.  At times it is productive but not always.  No other signs of heart failure that I can tell during my interview with his sister.  No syncope or presyncope.  The patient's sister tells me that she has had trouble finding a primary care physician for [name].  The patient does not have symptoms concerning for COVID-19 infection (fever, chills, cough, or new shortness of breath).    Past Medical History:  Diagnosis Date  . Asthma   . Autistic disorder of childhood onset   . Bilateral cataracts 05/02/2011  . Deaf   . History of blood transfusion    At [facility]  . Mental retardation 05/02/2011  . Osteoarthritis    right ankle   Past Surgical History:  Procedure Laterality Date  . EYE SURGERY    . HERNIA REPAIR    . INGUINAL HERNIA REPAIR       Current Meds  Medication Sig  . albuterol (VENTOLIN HFA) 108 (90 Base) MCG/ACT inhaler SMARTSIG:2 Puff(s) By Mouth 4 Times Daily  . [DISCONTINUED] amoxicillin-clavulanate (AUGMENTIN) 875-125 MG tablet Take 1 tablet by mouth 2 (two) times  daily.     Allergies:   Patient has no known allergies.   Social History   Tobacco Use  . Smoking status: Never Smoker  . Smokeless tobacco: Never Used  Substance Use Topics  . Alcohol use: No  . Drug use: No     Family Hx: The patient's family history includes Alcohol abuse in an other family member; Coronary artery disease in his father; Diabetes in his father and another family member; Heart disease in an other family member; Hypertension in an other family member.  ROS:   Please see the history of present illness.     All other systems reviewed and are negative.   Prior CV studies:   The following  studies were reviewed today:  04/12/2020 ECG Narrow complex tachycardia.  Heart rate approximately 180 bpm.    04/12/2020 ECG Sinus rhythm with an incomplete right bundle branch block.  This EKG was obtained after treatment with adenosine.  No preexcitation    March 15, 2020 EKG Sinus rhythm with an incomplete right bundle-branch block.  No preexcitation   Labs/Other Tests and Data Reviewed:    EKG: Above EKGs were reviewed  Recent Labs: 03/15/2020: TSH 5.202 04/12/2020: BUN 19; Creatinine, Ser 1.13; Hemoglobin 17.9; Platelets 293; Potassium 3.3; Sodium 139   Recent Lipid Panel Lab Results  Component Value Date/Time   CHOL 245 (H) 05/05/2012 01:58 PM   TRIG 151.0 (H) 05/05/2012 01:58 PM   HDL 28.70 (L) 05/05/2012 01:58 PM   CHOLHDL 9 05/05/2012 01:58 PM   LDLCALC 124 (H) 05/02/2011 01:38 PM   LDLDIRECT 187.2 05/05/2012 01:58 PM    Wt Readings from Last 3 Encounters:  04/17/20 280 lb (127 kg)  04/12/20 288 lb 9.3 oz (130.9 kg)  03/15/20 288 lb 9.3 oz (130.9 kg)     Risk Assessment/Calculations:      Objective:    Vital Signs:  Ht 5\' 9"  (1.753 m)   Wt 280 lb (127 kg)   BMI 41.35 kg/m      ASSESSMENT & PLAN:    1. Narrow complex tachycardia Mr. Mantione has recurrent episodes of symptomatic narrow complex tachycardia with heart rates in the 180s.  These have been adenosine sensitive.  Most likely explanation is episodes of AVNRT.  Today, I discussed the options for managing his recurrent symptomatic SVT including medical therapy (beta-blockers and calcium channel blockers) versus EP study and ablation.  I do not think immediately reaching for a beta-blocker is the right option given the chronic cough.  Would like this to be further evaluated by primary care physician.  I would also like to better characterize the structure of his heart to better guide therapy.  I discussed EP study and ablation in detail with the patient sister during today's visit including the  risks, expected recovery and expected success rates if it is AVNRT.  She would like to pursue ablation therapy in an effort to avoid long-term medication use and recurrent trips emergency department.  I discussed with Mr. Borawski sister details regarding the day of the procedure.  She tells me that he has undergone procedures in the past for his eyes.  He does well with surgical procedures as long as she is able to stay with him until he is sedated.  I suspect the best way to proceed would be for her to be involved the day of the procedure until he is off to sleep with anesthesia.  Once we are waking him up we can have her get  involved again to facilitate communication.  We will get an echocardiogram prior to the procedure date.   Risk, benefits, and alternatives to EP study and radiofrequency ablation for SVT were also discussed in detail today. These risks include but are not limited to complete heart block, stroke, bleeding, vascular damage, tamponade, perforation, and death. The patient understands these risk and wishes to proceed.  We will therefore proceed with catheter ablation at the next available time.  Carto and ICE are requested for the procedure.  Will also obtain a TTE prior to the procedure.       Medication Adjustments/Labs and Tests Ordered: Current medicines are reviewed at length with the patient today.  Concerns regarding medicines are outlined above.   Tests Ordered: No orders of the defined types were placed in this encounter.   Medication Changes: No orders of the defined types were placed in this encounter.   Time:   Today, I have spent 45 minutes with the patient with telehealth technology discussing the above problems.      Follow Up:  In Person   Signed, Lanier Prude, MD  04/17/2020 11:37 AM    [facility]

## 2020-04-18 NOTE — Telephone Encounter (Signed)
Left detailed message per DPR requesting call back to schedule ablation.  Also sent mychart message.

## 2020-04-19 ENCOUNTER — Other Ambulatory Visit (HOSPITAL_COMMUNITY): Payer: Medicare HMO

## 2020-04-19 NOTE — Addendum Note (Signed)
Addended by: Steffanie Dunn T on: 04/19/2020 06:58 AM   Modules accepted: Level of Service

## 2020-04-24 NOTE — Telephone Encounter (Signed)
Left message requesting call back to schedule procedure.

## 2020-04-25 NOTE — Telephone Encounter (Signed)
Outreach x 3.  Left message requesting call back.  Will send letter.  Put in 3 mo recall to follow with Pt.  Will await call back from sister.

## 2020-05-01 ENCOUNTER — Other Ambulatory Visit: Payer: Self-pay

## 2020-05-01 ENCOUNTER — Emergency Department (HOSPITAL_BASED_OUTPATIENT_CLINIC_OR_DEPARTMENT_OTHER)
Admission: EM | Admit: 2020-05-01 | Discharge: 2020-05-01 | Disposition: A | Discharge status: Home / Self Care | Payer: Medicare HMO | Attending: Emergency Medicine | Admitting: Emergency Medicine

## 2020-05-01 ENCOUNTER — Encounter (HOSPITAL_BASED_OUTPATIENT_CLINIC_OR_DEPARTMENT_OTHER): Payer: Self-pay | Admitting: *Deleted

## 2020-05-01 DIAGNOSIS — I471 Supraventricular tachycardia: Secondary | ICD-10-CM | POA: Diagnosis not present

## 2020-05-01 DIAGNOSIS — R002 Palpitations: Secondary | ICD-10-CM | POA: Diagnosis present

## 2020-05-01 DIAGNOSIS — J45901 Unspecified asthma with (acute) exacerbation: Secondary | ICD-10-CM | POA: Insufficient documentation

## 2020-05-01 DIAGNOSIS — F840 Autistic disorder: Secondary | ICD-10-CM | POA: Insufficient documentation

## 2020-05-01 DIAGNOSIS — Z7951 Long term (current) use of inhaled steroids: Secondary | ICD-10-CM | POA: Diagnosis not present

## 2020-05-01 LAB — COMPREHENSIVE METABOLIC PANEL
ALT: 21 U/L (ref 0–44)
AST: 21 U/L (ref 15–41)
Albumin: 3.9 g/dL (ref 3.5–5.0)
Alkaline Phosphatase: 59 U/L (ref 38–126)
Anion gap: 13 (ref 5–15)
BUN: 22 mg/dL — ABNORMAL HIGH (ref 6–20)
CO2: 20 mmol/L — ABNORMAL LOW (ref 22–32)
Calcium: 8.9 mg/dL (ref 8.9–10.3)
Chloride: 103 mmol/L (ref 98–111)
Creatinine, Ser: 1.21 mg/dL (ref 0.61–1.24)
GFR, Estimated: >60 mL/min (ref >60)
Glucose, Bld: 86 mg/dL (ref 70–99)
Potassium: 4 mmol/L (ref 3.5–5.1)
Sodium: 136 mmol/L (ref 135–145)
Total Bilirubin: 0.5 mg/dL (ref 0.3–1.2)
Total Protein: 7.4 g/dL (ref 6.5–8.1)

## 2020-05-01 LAB — CBC WITH DIFFERENTIAL/PLATELET
Abs Immature Granulocytes: 0.03 10*3/uL (ref 0.00–0.07)
Basophils Absolute: 0 10*3/uL (ref 0.0–0.1)
Basophils Relative: 0 %
Eosinophils Absolute: 0.4 10*3/uL (ref 0.0–0.5)
Eosinophils Relative: 4 %
HCT: 48.2 % (ref 39.0–52.0)
Hemoglobin: 16.3 g/dL (ref 13.0–17.0)
Immature Granulocytes: 0 %
Lymphocytes Relative: 14 %
Lymphs Abs: 1.4 10*3/uL (ref 0.7–4.0)
MCH: 31 pg (ref 26.0–34.0)
MCHC: 33.8 g/dL (ref 30.0–36.0)
MCV: 91.8 fL (ref 80.0–100.0)
Monocytes Absolute: 1.1 10*3/uL — ABNORMAL HIGH (ref 0.1–1.0)
Monocytes Relative: 12 %
Neutro Abs: 6.8 10*3/uL (ref 1.7–7.7)
Neutrophils Relative %: 70 %
Platelets: 317 10*3/uL (ref 150–400)
RBC: 5.25 MIL/uL (ref 4.22–5.81)
RDW: 12.5 % (ref 11.5–15.5)
WBC: 9.7 10*3/uL (ref 4.0–10.5)
nRBC: 0 % (ref 0.0–0.2)

## 2020-05-01 LAB — MAGNESIUM: Magnesium: 1.9 mg/dL (ref 1.7–2.4)

## 2020-05-01 MED ORDER — SODIUM CHLORIDE 0.9 % IV BOLUS
1000.0000 mL | Freq: Once | INTRAVENOUS | Status: AC
  Administered 2020-05-01: 1000 mL via INTRAVENOUS

## 2020-05-01 MED ORDER — ADENOSINE 6 MG/2ML IV SOLN
INTRAVENOUS | Status: AC
  Filled 2020-05-01: qty 8

## 2020-05-01 NOTE — ED Notes (Signed)
ED Provider at bedside. 

## 2020-05-01 NOTE — ED Provider Notes (Addendum)
[facility] EMERGENCY DEPARTMENT Provider Note   CSN: [id number] Arrival date & time: 05/01/20  1629     History Chief Complaint  Patient presents with  . Palpitations    Daniel Summers is a 55 y.o. male history of autism, deafness, intellectual delay, presenting to the emergency department with palpitations.  History is provided by his sister at bedside, as the patient is a poor historian due to intellectual disability.  She reports that he began complaining of chest pressure approximately 45 minutes prior to arrival.  He has had 2 prior episodes similar to this in the past 3 months, prompting ED visits for SVTs.  These have been treated with adenosine successfully in the emergency department.  They have an upcoming cardiology appointment in 6 days, which is a first-time appointment.  He has no known history of cardiac disease.  He does have a history of obesity.  He does not drink alcohol or any kind of caffeine.  Per medical records, patient was recently treated on January 13 in the emergency department for SVT with adenosine.  At time he had a negative Covid swab.  HPI     Past Medical History:  Diagnosis Date  . Asthma   . Autistic disorder of childhood onset   . Bilateral cataracts 05/02/2011  . Deaf   . History of blood transfusion    At [facility]  . Mental retardation 05/02/2011  . Osteoarthritis    right ankle    Patient Active Problem List   Diagnosis Date Noted  . Achilles tendinosis 01/17/2013  . OSA (obstructive sleep apnea) 12/08/2012  . Acute bronchitis 07/28/2012  . Unspecified asthma, with exacerbation 07/28/2012  . Pre-ulcerative corn or callous 05/05/2012  . Right shoulder pain 12/02/2011  . Low back pain 12/02/2011  . Bilateral ankle pain 12/02/2011  . Mental retardation 05/02/2011  . Bilateral cataracts 05/02/2011  . Asthma   . Osteoarthritis   . Preventative health care 04/27/2011  . Deaf     Past Surgical History:  Procedure Laterality  Date  . EYE SURGERY    . HERNIA REPAIR    . INGUINAL HERNIA REPAIR         Family History  Problem Relation Age of Onset  . Diabetes Father   . Coronary artery disease Father   . Heart disease Other   . Hypertension Other   . Diabetes Other   . Alcohol abuse Other     Social History   Tobacco Use  . Smoking status: Never Smoker  . Smokeless tobacco: Never Used  Substance Use Topics  . Alcohol use: No  . Drug use: No    Home Medications Prior to Admission medications   Medication Sig Start Date End Date Taking? Authorizing Provider  albuterol (VENTOLIN HFA) 108 (90 Base) MCG/ACT inhaler SMARTSIG:2 Puff(s) By Mouth 4 Times Daily 01/29/20   [provider]    Allergies    Patient has no known allergies.  Review of Systems   Review of Systems  Unable to perform ROS: Other (Severe austism, level 5 caveat)    Physical Exam Updated Vital Signs BP (!) 113/59   Pulse 94   Temp 97.9 F (36.6 C)   Resp 11   SpO2 99%   Physical Exam Constitutional:      General: He is not in acute distress.    Appearance: He is obese.  HENT:     Head: Normocephalic and atraumatic.  Eyes:     Conjunctiva/sclera: Conjunctivae  normal.     Pupils: Pupils are equal, round, and reactive to light.  Cardiovascular:     Rate and Rhythm: Regular rhythm. Tachycardia present.  Pulmonary:     Effort: Pulmonary effort is normal. No respiratory distress.  Abdominal:     General: There is no distension.     Tenderness: There is no abdominal tenderness.  Skin:    General: Skin is warm and dry.  Neurological:     General: No focal deficit present.     Mental Status: He is alert. Mental status is at baseline.  Psychiatric:        Mood and Affect: Mood normal.        Behavior: Behavior normal.     ED Results / Procedures / Treatments   Labs (all labs ordered are listed, but only abnormal results are displayed) Labs Reviewed  CBC WITH DIFFERENTIAL/PLATELET - Abnormal; Notable  for the following components:      Result Value   Monocytes Absolute 1.1 (*)    All other components within normal limits  COMPREHENSIVE METABOLIC PANEL - Abnormal; Notable for the following components:   CO2 20 (*)    BUN 22 (*)    All other components within normal limits  MAGNESIUM    EKG EKG Interpretation  Date/Time:  Tuesday May 01 2020 16:49:36 EST Ventricular Rate:  106 PR Interval:    QRS Duration: 111 QT Interval:  315 QTC Calculation: 419 R Axis:   115 Text Interpretation: Sinus tachycardia Atrial premature complex Consider right atrial enlargement Right ventricular hypertrophy No STEMI Confirmed by Alvester Chou [phone]) on 05/01/2020 5:03:27 PM   Radiology No results found.  Procedures .Cardioversion  Date/Time: 05/01/2020 5:01 PM Performed by: Terald Sleeper, MD Authorized by: Terald Sleeper, MD   Consent:    Consent obtained:  Written   Consent given by:  Guardian   Risks discussed:  Induced arrhythmia and pain Universal protocol:    Procedure explained and questions answered to patient or proxy's satisfaction: yes     Relevant documents present and verified: yes     Test results available and properly labeled: yes     Imaging studies available: yes     Patient identity confirmed:  Arm band Pre-procedure details:    Cardioversion basis:  Emergent   Rhythm:  Supraventricular tachycardia Patient sedated: No Comments:     Chemical cardioversion with 6 mg IV adenosine, no sedation used.  Successful conversion to normal sinus rhythm.    .Critical Care Performed by: Terald Sleeper, MD Authorized by: Terald Sleeper, MD   Critical care provider statement:    Critical care time (minutes):  37   Critical care was necessary to treat or prevent imminent or life-threatening deterioration of the following conditions:  Circulatory failure   Critical care was time spent personally by me on the following activities:  Discussions with  consultants, evaluation of patient's response to treatment, examination of patient, ordering and performing treatments and interventions, ordering and review of laboratory studies, ordering and review of radiographic studies, pulse oximetry, re-evaluation of patient's condition, obtaining history from patient or surrogate and review of old charts     Medications Ordered in ED Medications  adenosine (ADENOCARD) 6 MG/2ML injection (  Given 05/01/20 1649)  sodium chloride 0.9 % bolus 1,000 mL (0 mLs Intravenous Stopped 05/01/20 1802)    ED Course  I have reviewed the triage vital signs and the nursing notes.  Pertinent labs & imaging results that  were available during my care of the patient were reviewed by me and considered in my medical decision making (see chart for details).  SVT on arrival, HR 200-210, BP stable, patient mentating well.  Rapidly placed on monitor and pads.  ECG reviewed personally showing SVT rhythm.  Prior medical records reviewed including previous ED visits for similar symptoms, treated successfully with adenosine.  Patient's sister consented for adenosine chemical cardioversion.  Successfully performed with 6 mg IV adenosine.  Labs reviewed personally - electrolytes wnl.  Okay for cardiology f/u next week.    Clinical Course as of 05/02/20 0040  Tue May 01, 2020  1651 Presented with SVT rate 210, given 6 mg IV adenosine after sister consented.  Subsequent conversion to sinus rhythm HR 98-100 bpm.  Will monitor [MT]  1726 Hr back to 80's and sinus [MT]  1748 Still in stable rhythm.  D/c with cards f/u in 1 week.  Discussed additional vagal manuevers to try at home [MT]    Clinical Course User Index [MT] Iysis Germain, Kermit Balo, MD    Final Clinical Impression(s) / ED Diagnoses Final diagnoses:  SVT (supraventricular tachycardia) [facility])    Rx / DC Orders ED Discharge Orders    None       Jeremyah Jelley, Kermit Balo, MD 05/02/20 0040    Terald Sleeper, MD 05/14/20  [phone]

## 2020-05-01 NOTE — Discharge Instructions (Addendum)
If this happens again, try dunking your face in a basin of ice water (just for a second or two). You can also blow into a tiny straw for 10 seconds while sitting upright, then have your family lay you quickly on your back and bring up your legs.  These are vagal manuevers designed to slow down your heart rate.

## 2020-05-01 NOTE — ED Triage Notes (Addendum)
Sister states increased HR x 45 mins, HX of same, denies CP and SOB , Cards/echo   sched appt 2/7

## 2020-05-01 NOTE — ED Notes (Signed)
Dr. Renaye Rakers at bedside as well as pharmacy, RT, 3 nurses and tech. Adenosine given, pt converted back to NSR

## 2020-05-01 NOTE — ED Notes (Signed)
Pt remains on continuous monitor. In NSR at a rate in 90's. NAD noted, sister at bedside with no complaints

## 2020-05-07 ENCOUNTER — Other Ambulatory Visit: Payer: Self-pay

## 2020-05-07 ENCOUNTER — Ambulatory Visit (HOSPITAL_COMMUNITY): Payer: Medicare HMO | Attending: Cardiology

## 2020-05-07 DIAGNOSIS — I471 Supraventricular tachycardia: Secondary | ICD-10-CM | POA: Diagnosis not present

## 2020-05-07 LAB — ECHOCARDIOGRAM COMPLETE
Area-P 1/2: 3.12 cm2
S' Lateral: 2.55 cm

## 2020-05-15 ENCOUNTER — Emergency Department (HOSPITAL_BASED_OUTPATIENT_CLINIC_OR_DEPARTMENT_OTHER): Payer: Medicare HMO

## 2020-05-15 ENCOUNTER — Emergency Department (HOSPITAL_BASED_OUTPATIENT_CLINIC_OR_DEPARTMENT_OTHER)
Admission: EM | Admit: 2020-05-15 | Discharge: 2020-05-16 | Disposition: A | Discharge status: Home / Self Care | Payer: Medicare HMO | Attending: Emergency Medicine | Admitting: Emergency Medicine

## 2020-05-15 ENCOUNTER — Other Ambulatory Visit: Payer: Self-pay

## 2020-05-15 ENCOUNTER — Encounter (HOSPITAL_BASED_OUTPATIENT_CLINIC_OR_DEPARTMENT_OTHER): Payer: Self-pay | Admitting: Emergency Medicine

## 2020-05-15 DIAGNOSIS — R079 Chest pain, unspecified: Secondary | ICD-10-CM

## 2020-05-15 DIAGNOSIS — R0602 Shortness of breath: Secondary | ICD-10-CM | POA: Diagnosis not present

## 2020-05-15 DIAGNOSIS — Z20822 Contact with and (suspected) exposure to covid-19: Secondary | ICD-10-CM | POA: Insufficient documentation

## 2020-05-15 DIAGNOSIS — J9811 Atelectasis: Secondary | ICD-10-CM | POA: Diagnosis not present

## 2020-05-15 DIAGNOSIS — I471 Supraventricular tachycardia: Secondary | ICD-10-CM | POA: Insufficient documentation

## 2020-05-15 DIAGNOSIS — J45909 Unspecified asthma, uncomplicated: Secondary | ICD-10-CM | POA: Diagnosis not present

## 2020-05-15 DIAGNOSIS — F840 Autistic disorder: Secondary | ICD-10-CM | POA: Insufficient documentation

## 2020-05-15 DIAGNOSIS — Z7951 Long term (current) use of inhaled steroids: Secondary | ICD-10-CM | POA: Diagnosis not present

## 2020-05-15 DIAGNOSIS — R0789 Other chest pain: Secondary | ICD-10-CM | POA: Diagnosis not present

## 2020-05-15 HISTORY — DX: Tachycardia, unspecified: R00.0

## 2020-05-15 LAB — COMPREHENSIVE METABOLIC PANEL
ALT: 22 U/L (ref 0–44)
AST: 21 U/L (ref 15–41)
Albumin: 3.6 g/dL (ref 3.5–5.0)
Alkaline Phosphatase: 54 U/L (ref 38–126)
Anion gap: 9 (ref 5–15)
BUN: 15 mg/dL (ref 6–20)
CO2: 26 mmol/L (ref 22–32)
Calcium: 8.8 mg/dL — ABNORMAL LOW (ref 8.9–10.3)
Chloride: 104 mmol/L (ref 98–111)
Creatinine, Ser: 1.1 mg/dL (ref 0.61–1.24)
GFR, Estimated: >60 mL/min (ref >60)
Glucose, Bld: 96 mg/dL (ref 70–99)
Potassium: 4.1 mmol/L (ref 3.5–5.1)
Sodium: 139 mmol/L (ref 135–145)
Total Bilirubin: 0.4 mg/dL (ref 0.3–1.2)
Total Protein: 6.7 g/dL (ref 6.5–8.1)

## 2020-05-15 LAB — CBC WITH DIFFERENTIAL/PLATELET
Abs Immature Granulocytes: 0.04 10*3/uL (ref 0.00–0.07)
Basophils Absolute: 0.1 10*3/uL (ref 0.0–0.1)
Basophils Relative: 1 %
Eosinophils Absolute: 0.3 10*3/uL (ref 0.0–0.5)
Eosinophils Relative: 3 %
HCT: 47.3 % (ref 39.0–52.0)
Hemoglobin: 16 g/dL (ref 13.0–17.0)
Immature Granulocytes: 1 %
Lymphocytes Relative: 12 %
Lymphs Abs: 1 10*3/uL (ref 0.7–4.0)
MCH: 31.7 pg (ref 26.0–34.0)
MCHC: 33.8 g/dL (ref 30.0–36.0)
MCV: 93.8 fL (ref 80.0–100.0)
Monocytes Absolute: 1 10*3/uL (ref 0.1–1.0)
Monocytes Relative: 13 %
Neutro Abs: 5.6 10*3/uL (ref 1.7–7.7)
Neutrophils Relative %: 70 %
Platelets: 291 10*3/uL (ref 150–400)
RBC: 5.04 MIL/uL (ref 4.22–5.81)
RDW: 12.8 % (ref 11.5–15.5)
WBC: 7.9 10*3/uL (ref 4.0–10.5)
nRBC: 0 % (ref 0.0–0.2)

## 2020-05-15 LAB — RESP PANEL BY RT-PCR (FLU A&B, COVID) ARPGX2
Influenza A by PCR: NEGATIVE
Influenza B by PCR: NEGATIVE
SARS Coronavirus 2 by RT PCR: NEGATIVE

## 2020-05-15 LAB — TROPONIN I (HIGH SENSITIVITY)
Troponin I (High Sensitivity): 4 ng/L (ref <18)
Troponin I (High Sensitivity): 4 ng/L (ref <18)

## 2020-05-15 MED ORDER — IBUPROFEN 400 MG PO TABS
600.0000 mg | ORAL_TABLET | Freq: Once | ORAL | Status: AC
  Administered 2020-05-15: 600 mg via ORAL
  Filled 2020-05-15: qty 1

## 2020-05-15 NOTE — ED Provider Notes (Signed)
[facility] EMERGENCY DEPARTMENT Provider Note   CSN: [id number] Arrival date & time: 05/15/20  1928     History Chief Complaint  Patient presents with  . Chest Pain    Dan EuropeJoseph D Otting is a 55 y.o. male with a history of childhood autism, obesity, deafness, tachycardia, presented to emergency department with chest pain or shortness of breath.  The patient presents with his sister whom he lives with, who helps to communicate with him through their personal sign language.  She reports that she arrived home from work this evening around 6 PM, noted the patient was complaining that he was having left-sided chest pain and shortness of breath.  He cannot get anymore information from the patient due to language limitations.  She said they checked his heart rate at home and it was normal.  She said he does not typically complained about chest pain.  Patient was seen in emergency department 2 weeks ago by myself and treated at time for SVT with adenosine.    He subsequently had a follow-up visit with cardiology and an echocardiogram performed 1 week ago, impression listed below.  He has no history of MI.  He does not smoke.  The patient is nonverbal and cannot provide history to me.  His sister reports that there were people in the house with Covid approximately 4 weeks ago.  She states she received 2 dose of the vaccine but not the booster.  She reports he has been very fatigued today and falling asleep.  He has had a dry cough for several "weeks."  IMPRESSIONS  1. Left ventricular ejection fraction, by estimation, is 60 to 65%. The  left ventricle has normal function. Left ventricular endocardial border  not optimally defined to evaluate regional wall motion. Left ventricular  diastolic parameters were normal.  2. Right ventricular systolic function is normal. The right ventricular  size is normal. Tricuspid regurgitation signal is inadequate for assessing  PA pressure.  3.  The mitral valve is grossly normal. No evidence of mitral valve  regurgitation. No evidence of mitral stenosis.  4. The aortic valve is tricuspid. Aortic valve regurgitation is not  visualized. No aortic stenosis is present.  5. The inferior vena cava is normal in size with greater than 50%  respiratory variability, suggesting right atrial pressure of 3 mmHg.   HPI     Past Medical History:  Diagnosis Date  . Asthma   . Autistic disorder of childhood onset   . Bilateral cataracts 05/02/2011  . Deaf   . [facility]  . Mental retardation 05/02/2011  . Osteoarthritis    right ankle  . Tachycardia     Patient Active Problem List   Diagnosis Date Noted  . Achilles tendinosis 01/17/2013  . OSA (obstructive sleep apnea) 12/08/2012  . Acute bronchitis 07/28/2012  . Unspecified asthma, with exacerbation 07/28/2012  . Pre-ulcerative corn or callous 05/05/2012  . Right shoulder pain 12/02/2011  . Low back pain 12/02/2011  . Bilateral ankle pain 12/02/2011  . Mental retardation 05/02/2011  . Bilateral cataracts 05/02/2011  . Asthma   . Osteoarthritis   . Preventative health care 04/27/2011  . Deaf     Past Surgical History:  Procedure Laterality Date  . EYE SURGERY    . HERNIA REPAIR    . INGUINAL HERNIA REPAIR         Family History  Problem Relation Age of Onset  . Diabetes Father   .  Coronary artery disease Father   . Heart disease Other   . Hypertension Other   . Diabetes Other   . Alcohol abuse Other     Social History   Tobacco Use  . Smoking status: Never Smoker  . Smokeless tobacco: Never Used  Substance Use Topics  . Alcohol use: No  . Drug use: No    Home Medications Prior to Admission medications   Medication Sig Start Date End Date Taking? Authorizing Provider  albuterol (VENTOLIN HFA) 108 (90 Base) MCG/ACT inhaler SMARTSIG:2 Puff(s) By Mouth 4 Times Daily 01/29/20   [provider]    Allergies     Patient has no known allergies.  Review of Systems   Review of Systems  Unable to perform ROS: Patient nonverbal (level 5 caveat)    Physical Exam Updated Vital Signs BP 125/72   Pulse 72   Temp 97.8 F (36.6 C)   Resp 19   Ht 5\' 9"  (1.753 m)   SpO2 100%   BMI 41.35 kg/m   Physical Exam Constitutional:      Appearance: He is obese.     Comments: Appears comfortable, no visible distress  HENT:     Head: Normocephalic and atraumatic.  Eyes:     Conjunctiva/sclera: Conjunctivae normal.     Pupils: Pupils are equal, round, and reactive to light.  Cardiovascular:     Rate and Rhythm: Normal rate and regular rhythm.  Pulmonary:     Effort: Pulmonary effort is normal. No respiratory distress.  Abdominal:     General: There is no distension.     Tenderness: There is no abdominal tenderness.  Skin:    General: Skin is warm and dry.  Neurological:     General: No focal deficit present.     Mental Status: He is alert. Mental status is at baseline.  Psychiatric:        Mood and Affect: Mood normal.        Behavior: Behavior normal.     ED Results / Procedures / Treatments   Labs (all labs ordered are listed, but only abnormal results are displayed) Labs Reviewed  COMPREHENSIVE METABOLIC PANEL - Abnormal; Notable for the following components:      Result Value   Calcium 8.8 (*)    All other components within normal limits  RESP PANEL BY RT-PCR (FLU A&B, COVID) [id number]  CBC WITH DIFFERENTIAL/PLATELET  TROPONIN I (HIGH SENSITIVITY)  TROPONIN I (HIGH SENSITIVITY)    EKG EKG Interpretation  Date/Time:  Tuesday May 15 2020 19:28:35 EST Ventricular Rate:  75 PR Interval:  138 QRS Duration: 106 QT Interval:  396 QTC Calculation: 442 R Axis:   76 Text Interpretation: Normal sinus rhythm Incomplete right bundle branch block Borderline ECG No STEMI Confirmed by [id number] [phone]) on 05/15/2020 7:55:42 PM   Radiology CT Chest Wo Contrast  Result Date:  05/15/2020 CLINICAL DATA:  Chest pressure and shortness of breath. EXAM: CT CHEST WITHOUT CONTRAST TECHNIQUE: Multidetector CT imaging of the chest was performed following the standard protocol without IV contrast. COMPARISON:  None. FINDINGS: The study is limited secondary to respiratory motion. Cardiovascular: There is mild calcification of the aortic arch. Normal heart size. No pericardial effusion. Mediastinum/Nodes: Small pretracheal and AP window lymph nodes are seen. Thyroid gland, trachea, and esophagus demonstrate no significant findings. Lungs/Pleura: Lungs are clear. No pleural effusion or pneumothorax. Upper Abdomen: There is a small hiatal hernia. Musculoskeletal: No chest wall mass or suspicious bone lesions identified. IMPRESSION:  1. No acute or active cardiopulmonary disease. 2. Small hiatal hernia. 3. Aortic atherosclerosis. Aortic Atherosclerosis (ICD10-I70.0). Electronically Signed   By: Aram Candela M.D.   On: 05/15/2020 21:49   DG Chest Portable 1 View  Result Date: 05/15/2020 CLINICAL DATA:  Shortness of breath EXAM: PORTABLE CHEST 1 VIEW COMPARISON:  04/12/2020 FINDINGS: The heart size and mediastinal contours are within normal limits. Streaky atelectasis left lung base. The visualized skeletal structures are unremarkable. IMPRESSION: No active disease.  Streaky atelectasis left lung base Electronically Signed   By: Jasmine Pang M.D.   On: 05/15/2020 20:35    Procedures Procedures   Medications Ordered in ED Medications  ibuprofen (ADVIL) tablet 600 mg (600 mg Oral Given 05/15/20 2153)    ED Course  I have reviewed the triage vital signs and the nursing notes.  Pertinent labs & imaging results that were available during my care of the patient were reviewed by me and considered in my medical decision making (see chart for details).  This patient presents to the Emergency Department with complaint of chest pain. This involves an extensive number of treatment options, and  is a complaint that carries with it a high risk of complications and morbidity.  The differential diagnosis includes ACS vs Pneumothorax vs Reflux/Gastritis vs MSK pain vs Pneumonia vs other.  I ordered, reviewed, and interpreted labs, including BMP and CBC.  There were no immediate, life-threatening emergencies found in this labwork.  The patient's troponin level was 4 -> 4.  Covid/flu were negative.  Vitals were stable and unremarkable throughout his stay. I ordered medication motrin for chest pain I ordered imaging studies which included CT chest to evaluate for PNA I independently visualized and interpreted imaging which showed no evident pneumonia, no pericardial effusion, and the monitor tracing which showed NSR Additional history was obtained from his sister at bedside Previous records obtained and reviewed showing outpatient echocardiogram 1 week ago I personally reviewed the patients ECG which showed sinus rhythm with no acute ischemic findings  With an unremarkable echocardiogram approx 1 week ago, and flat troponins, my overall suspicion for ACS or aortic dissection is low.  Likewise with PE with no tachycardia, hypoxia, tachypnea, or respiratory complaints.  After the interventions stated above, I reevaluated the patient and found that they remained clinically stable.  When asked by his sister if he was continuing to have chest pain, he answered both yes and then no.  There appears to be some communication barriers here.  I asked if she prefers a sign language interpreter, and she states no, that they have their own method of communication.  Based on the patient's clinical exam, vital signs, risk factors, and ED testing, I felt that the patient's overall risk of life-threatening emergency such as ACS, PE, sepsis, or infection was low.  I explained to the patient and his sister that this evaluation was not a definitive diagnostic workup, and I will contact Dr. Lalla Brothers their new cardiologist  about setting up an office visit.  Return precautions were discussed with the patient.  I felt the patient was clinically stable for discharge.   Clinical Course as of 05/16/20 1127  Tue May 15, 2020  2154 IMPRESSION: 1. No acute or active cardiopulmonary disease. 2. Small hiatal hernia. 3. Aortic atherosclerosis. [MT]  2343 Some improvement of pain - delta trop negative, covid negative, will d/c with cards f/u [MT]    Clinical Course User Index [MT] Ryelynn Guedea, Kermit Balo, MD    Final Clinical  Impression(s) / ED Diagnoses Final diagnoses:  Chest pain, unspecified type    Rx / DC Orders ED Discharge Orders    None       Terald Sleeper, MD 05/16/20 [phone]

## 2020-05-15 NOTE — ED Triage Notes (Addendum)
Per sister pt is having pressure in his chest and shob. Pt is deaf and autistic and doesn't communicate well. Pt has hx of tachycardia. Pt has had cough x 7 weeks, had echocardiogram done last week, but has not received results.

## 2020-05-16 ENCOUNTER — Telehealth: Payer: Self-pay

## 2020-05-16 ENCOUNTER — Emergency Department (HOSPITAL_BASED_OUTPATIENT_CLINIC_OR_DEPARTMENT_OTHER)
Admission: EM | Admit: 2020-05-16 | Discharge: 2020-05-16 | Disposition: A | Discharge status: Home / Self Care | Payer: Medicare HMO | Source: Home / Self Care | Attending: Emergency Medicine | Admitting: Emergency Medicine

## 2020-05-16 ENCOUNTER — Other Ambulatory Visit: Payer: Self-pay

## 2020-05-16 ENCOUNTER — Encounter (HOSPITAL_BASED_OUTPATIENT_CLINIC_OR_DEPARTMENT_OTHER): Payer: Self-pay | Admitting: Emergency Medicine

## 2020-05-16 DIAGNOSIS — J45901 Unspecified asthma with (acute) exacerbation: Secondary | ICD-10-CM | POA: Insufficient documentation

## 2020-05-16 DIAGNOSIS — Z7951 Long term (current) use of inhaled steroids: Secondary | ICD-10-CM | POA: Insufficient documentation

## 2020-05-16 DIAGNOSIS — I471 Supraventricular tachycardia: Secondary | ICD-10-CM

## 2020-05-16 DIAGNOSIS — F840 Autistic disorder: Secondary | ICD-10-CM | POA: Insufficient documentation

## 2020-05-16 LAB — CBC WITH DIFFERENTIAL/PLATELET
Abs Immature Granulocytes: 0.06 10*3/uL (ref 0.00–0.07)
Basophils Absolute: 0.1 10*3/uL (ref 0.0–0.1)
Basophils Relative: 1 %
Eosinophils Absolute: 0.3 10*3/uL (ref 0.0–0.5)
Eosinophils Relative: 4 %
HCT: 48.8 % (ref 39.0–52.0)
Hemoglobin: 16.6 g/dL (ref 13.0–17.0)
Immature Granulocytes: 1 %
Lymphocytes Relative: 12 %
Lymphs Abs: 1.1 10*3/uL (ref 0.7–4.0)
MCH: 31.3 pg (ref 26.0–34.0)
MCHC: 34 g/dL (ref 30.0–36.0)
MCV: 92.1 fL (ref 80.0–100.0)
Monocytes Absolute: 1.1 10*3/uL — ABNORMAL HIGH (ref 0.1–1.0)
Monocytes Relative: 12 %
Neutro Abs: 6.5 10*3/uL (ref 1.7–7.7)
Neutrophils Relative %: 70 %
Platelets: 363 10*3/uL (ref 150–400)
RBC: 5.3 MIL/uL (ref 4.22–5.81)
RDW: 12.6 % (ref 11.5–15.5)
WBC: 9.2 10*3/uL (ref 4.0–10.5)
nRBC: 0 % (ref 0.0–0.2)

## 2020-05-16 LAB — BASIC METABOLIC PANEL
Anion gap: 10 (ref 5–15)
BUN: 16 mg/dL (ref 6–20)
CO2: 22 mmol/L (ref 22–32)
Calcium: 8.7 mg/dL — ABNORMAL LOW (ref 8.9–10.3)
Chloride: 105 mmol/L (ref 98–111)
Creatinine, Ser: 1.03 mg/dL (ref 0.61–1.24)
GFR, Estimated: >60 mL/min (ref >60)
Glucose, Bld: 108 mg/dL — ABNORMAL HIGH (ref 70–99)
Potassium: 3.6 mmol/L (ref 3.5–5.1)
Sodium: 137 mmol/L (ref 135–145)

## 2020-05-16 MED ORDER — METOPROLOL SUCCINATE ER 25 MG PO TB24: 25.0000 mg | ORAL_TABLET | Freq: Two times a day (BID) | ORAL | 3 refills | Status: DC

## 2020-05-16 MED ORDER — ADENOSINE 6 MG/2ML IV SOLN
INTRAVENOUS | Status: AC
  Administered 2020-05-16: 6 mg via INTRAVENOUS
  Filled 2020-05-16: qty 6

## 2020-05-16 MED ORDER — ADENOSINE 6 MG/2ML IV SOLN: 6.0000 mg | Freq: Once | INTRAVENOUS | Status: AC

## 2020-05-16 NOTE — Telephone Encounter (Signed)
Received message that Pt's sister was taking Pt to ER for evaluation.  Sister distraught about continued need for emergency care for Pt and ready to schedule SVT ablation.  Per review of Dr. Lovena Neighbours surgical schedule, next available will be June 08, 2020.  Per Dr. Simeon Craft Pt start metoprolol succinate 25 mg one tablet by mouth BID in interim.  Order entered and sent to Pt's pharmacy.  Will get Pt scheduled for SVT ablation and contact sister.

## 2020-05-16 NOTE — Discharge Instructions (Addendum)
You were seen in the emergency department for chest pain and rapid heart rate.  Unfortunately were back in SVT.  Your symptoms improved with adenosine.  You were observed on the cardiac monitor with improvement in your rate.  Please follow-up with cardiology as scheduled.  Return to the emergency department if any worsening or concerning symptoms
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
## 2020-05-16 NOTE — ED Notes (Signed)
Dr. Charm Barges and Augusto Gamble - Charge RN aware pt's HR.

## 2020-05-16 NOTE — Telephone Encounter (Signed)
Outreach made to Pt.  Advised of new start Toprol XL sent to pharmacy.  Instruction for ablation given (see instruction letter in Pt chart)  Mailing instruction letter also.  Work up complete.  Advised to call office if any further issues.

## 2020-05-16 NOTE — ED Provider Notes (Signed)
[facility] EMERGENCY DEPARTMENT Provider Note   CSN: [id number] Arrival date & time: 05/16/20  1045     History Chief Complaint  Patient presents with  . Tachycardia    Daniel Summers is a 55 y.o. male.  He is deaf and has a history of autistic disorder.  History being provided by patient's sister.  Multiple recent visits for tachycardia chest pain.  Diagnosed with SVT and is following with cardiology pending an ablation.  Acute onset of pain in his chest this morning.  Rapid heart rate.  Radiates into his neck.  The history is provided by the patient and a relative. The history is limited by a developmental delay.  Palpitations Palpitations quality:  Fast Onset quality:  Sudden Timing:  Constant Progression:  Unchanged Chronicity:  Recurrent Relieved by:  Nothing Worsened by:  Nothing Ineffective treatments:  Valsalva Associated symptoms: chest pain   Associated symptoms: no diaphoresis, no nausea, no shortness of breath and no vomiting        Past Medical History:  Diagnosis Date  . Asthma   . Autistic disorder of childhood onset   . Bilateral cataracts 05/02/2011  . Deaf   . History of blood transfusion    At [facility]  . Mental retardation 05/02/2011  . Osteoarthritis    right ankle  . Tachycardia     Patient Active Problem List   Diagnosis Date Noted  . Achilles tendinosis 01/17/2013  . OSA (obstructive sleep apnea) 12/08/2012  . Acute bronchitis 07/28/2012  . Unspecified asthma, with exacerbation 07/28/2012  . Pre-ulcerative corn or callous 05/05/2012  . Right shoulder pain 12/02/2011  . Low back pain 12/02/2011  . Bilateral ankle pain 12/02/2011  . Mental retardation 05/02/2011  . Bilateral cataracts 05/02/2011  . Asthma   . Osteoarthritis   . Preventative health care 04/27/2011  . Deaf     Past Surgical History:  Procedure Laterality Date  . EYE SURGERY    . HERNIA REPAIR    . INGUINAL HERNIA REPAIR         Family History  Problem  Relation Age of Onset  . Diabetes Father   . Coronary artery disease Father   . Heart disease Other   . Hypertension Other   . Diabetes Other   . Alcohol abuse Other     Social History   Tobacco Use  . Smoking status: Never Smoker  . Smokeless tobacco: Never Used  Substance Use Topics  . Alcohol use: No  . Drug use: No    Home Medications Prior to Admission medications   Medication Sig Start Date End Date Taking? Authorizing Provider  albuterol (VENTOLIN HFA) 108 (90 Base) MCG/ACT inhaler SMARTSIG:2 Puff(s) By Mouth 4 Times Daily 01/29/20   [provider]    Allergies    Patient has no known allergies.  Review of Systems   Review of Systems  Unable to perform ROS: Other  Constitutional: Negative for diaphoresis.  Respiratory: Negative for shortness of breath.   Cardiovascular: Positive for chest pain and palpitations.  Gastrointestinal: Negative for nausea and vomiting.    Physical Exam Updated Vital Signs BP 100/74 (BP Location: Left Arm)   Pulse 98   Temp 98.6 F (37 C) (Oral)   Resp 16   Ht 5\' 9"  (1.753 m)   Wt 127 kg   SpO2 97%   BMI 41.35 kg/m   Physical Exam Vitals and nursing note reviewed.  Constitutional:      Appearance: Normal appearance.  He is well-developed and well-nourished.  HENT:     Head: Normocephalic and atraumatic.  Eyes:     Conjunctiva/sclera: Conjunctivae normal.  Cardiovascular:     Rate and Rhythm: Regular rhythm. Tachycardia present.     Pulses: Normal pulses.     Heart sounds: No murmur heard.   Pulmonary:     Effort: Pulmonary effort is normal. No respiratory distress.     Breath sounds: Normal breath sounds.  Abdominal:     Palpations: Abdomen is soft.     Tenderness: There is no abdominal tenderness. There is no guarding or rebound.  Musculoskeletal:        General: Normal range of motion.     Cervical back: Neck supple.     Right lower leg: Edema present.     Left lower leg: Edema present.  Skin:     General: Skin is warm and dry.  Neurological:     Mental Status: He is alert. Mental status is at baseline.  Psychiatric:        Mood and Affect: Mood and affect normal.     ED Results / Procedures / Treatments   Labs (all labs ordered are listed, but only abnormal results are displayed) Labs Reviewed  BASIC METABOLIC PANEL - Abnormal; Notable for the following components:      Result Value   Glucose, Bld 108 (*)    Calcium 8.7 (*)    All other components within normal limits  CBC WITH DIFFERENTIAL/PLATELET - Abnormal; Notable for the following components:   Monocytes Absolute 1.1 (*)    All other components within normal limits    EKG EKG Interpretation  Date/Time:  Wednesday May 16 2020 11:11:15 EST Ventricular Rate:  94 PR Interval:    QRS Duration: 107 QT Interval:  352 QTC Calculation: 441 R Axis:   118 Text Interpretation: Sinus rhythm Probable right ventricular hypertrophy Borderline ST elevation, lateral leads resolved SVT Reconfirmed by Meridee Score [phone]) on 05/16/2020 12:24:11 PM   Radiology CT Chest Wo Contrast  Result Date: 05/15/2020 CLINICAL DATA:  Chest pressure and shortness of breath. EXAM: CT CHEST WITHOUT CONTRAST TECHNIQUE: Multidetector CT imaging of the chest was performed following the standard protocol without IV contrast. COMPARISON:  None. FINDINGS: The study is limited secondary to respiratory motion. Cardiovascular: There is mild calcification of the aortic arch. Normal heart size. No pericardial effusion. Mediastinum/Nodes: Small pretracheal and AP window lymph nodes are seen. Thyroid gland, trachea, and esophagus demonstrate no significant findings. Lungs/Pleura: Lungs are clear. No pleural effusion or pneumothorax. Upper Abdomen: There is a small hiatal hernia. Musculoskeletal: No chest wall mass or suspicious bone lesions identified. IMPRESSION: 1. No acute or active cardiopulmonary disease. 2. Small hiatal hernia. 3. Aortic  atherosclerosis. Aortic Atherosclerosis (ICD10-I70.0). Electronically Signed   By: Aram Candela M.D.   On: 05/15/2020 21:49   DG Chest Portable 1 View  Result Date: 05/15/2020 CLINICAL DATA:  Shortness of breath EXAM: PORTABLE CHEST 1 VIEW COMPARISON:  04/12/2020 FINDINGS: The heart size and mediastinal contours are within normal limits. Streaky atelectasis left lung base. The visualized skeletal structures are unremarkable. IMPRESSION: No active disease.  Streaky atelectasis left lung base Electronically Signed   By: Jasmine Pang M.D.   On: 05/15/2020 20:35    Procedures .Cardioversion  Date/Time: 05/16/2020 7:30 PM Performed by: Terrilee Files, MD Authorized by: Terrilee Files, MD   Consent:    Consent obtained:  Verbal   Consent given by:  Patient and guardian  Risks discussed:  Induced arrhythmia   Alternatives discussed:  Rate-control medication, alternative treatment and referral Pre-procedure details:    Cardioversion basis:  Emergent   Rhythm:  Supraventricular tachycardia   Electrode placement:  Anterior-posterior Patient sedated: No Post-procedure details:    Patient status:  Awake   Patient tolerance of procedure:  Tolerated well, no immediate complications Comments:     Patient was placed on pacer pads.  He was given 6 mg of adenosine IV push with brief pause and return to normal sinus rhythm with a rate around 100.  Follow-up EKG confirms normal sinus rhythm.  Patient tolerated procedure well.       Medications Ordered in ED Medications  adenosine (ADENOCARD) 6 MG/2ML injection 6 mg (6 mg Intravenous Given 05/16/20 1106)    ED Course  I have reviewed the triage vital signs and the nursing notes.  Pertinent labs & imaging results that were available during my care of the patient were reviewed by me and considered in my medical decision making (see chart for details).  Clinical Course as of 05/16/20 1927  Wed May 16, 2020  1210 Assessed patient.  He  is resting comfortably.  Blood pressure little soft IV fluids infusing. [MB]  1216 EKGs not crossing into the computer.  SVT rate of 206 nonspecific ST and T wave abnormalities. Repeat EKG after adenosine shows normal sinus rhythm. [MB]    Clinical Course User Index [MB] Terrilee Files, MD   MDM Rules/Calculators/A&P                         This patient complains of tachycardia chest pain; this involves an extensive number of treatment Options and is a complaint that carries with it a high risk of complications and Morbidity. The differential includes ACS, arrhythmia, pneumonia, pneumothorax, metabolic derangement  I ordered, reviewed and interpreted labs, which included CBC with normal white count normal hemoglobin, chemistries normal other than mildly elevated glucose I ordered medication IV adenosine and IV fluids with improvement in patient's heart rate Additional history obtained from patient's sister Previous records obtained and reviewed in epic including for recent ED visits for similar presentations.  He converted with 6 mg of adenosine 3 of times and one other time required 12  After the interventions stated above, I reevaluated the patient and found patient to be returned to normal sinus rhythm.  He was observed for period time on cardiac monitor without any further arrhythmias.  They are comfortable for returning home and following up with cardiology.  Return instructions discussed   Final Clinical Impression(s) / ED Diagnoses Final diagnoses:  SVT (supraventricular tachycardia) (HCC)    Rx / DC Orders ED Discharge Orders    None       Terrilee Files, MD 05/16/20 1931

## 2020-06-06 ENCOUNTER — Telehealth: Payer: Self-pay | Admitting: Cardiology

## 2020-06-06 ENCOUNTER — Other Ambulatory Visit (HOSPITAL_COMMUNITY)
Admit: 2020-06-06 | Discharge: 2020-06-06 | Disposition: A | Discharge status: Home / Self Care | Payer: Medicare HMO | Source: Ambulatory Visit | Attending: Cardiology | Admitting: Cardiology

## 2020-06-06 DIAGNOSIS — Z20822 Contact with and (suspected) exposure to covid-19: Secondary | ICD-10-CM | POA: Insufficient documentation

## 2020-06-06 DIAGNOSIS — I471 Supraventricular tachycardia: Secondary | ICD-10-CM

## 2020-06-06 DIAGNOSIS — Z01812 Encounter for preprocedural laboratory examination: Secondary | ICD-10-CM | POA: Insufficient documentation

## 2020-06-06 LAB — SARS CORONAVIRUS 2 (TAT 6-24 HRS): SARS Coronavirus 2: NEGATIVE

## 2020-06-06 NOTE — Telephone Encounter (Signed)
Left VM for sister advising covid test was negative.

## 2020-06-06 NOTE — Telephone Encounter (Signed)
Left 2nd VM.  Advised would call first thing tomorrow morning to discuss plan for Pt.

## 2020-06-06 NOTE — Telephone Encounter (Signed)
Patient's sister states the patient has surgery on Friday, but he has a fever and congested for 2 days. She states he has also been coughing a lot. She is concerned about his symptoms for the upcoming surgery. She states he is also deaf and autistic and that she is his caregiver. She states it is okay to leave a message.

## 2020-06-07 NOTE — Telephone Encounter (Signed)
Spoke with Pt's sister.  Pt does not have a fever but is still congested and was up all night coughing.   Advised would need to reschedule ablation.  Will discuss with Dr. Lalla Brothers.

## 2020-06-08 ENCOUNTER — Encounter (HOSPITAL_COMMUNITY): Admission: RE | Payer: Self-pay | Source: Home / Self Care

## 2020-06-08 ENCOUNTER — Ambulatory Visit (HOSPITAL_COMMUNITY): Admission: RE | Admit: 2020-06-08 | Payer: Medicare HMO | Source: Home / Self Care | Admitting: Cardiology

## 2020-06-08 SURGERY — SVT ABLATION
Anesthesia: General

## 2020-06-12 NOTE — Telephone Encounter (Signed)
Left detailed message for sister.  Would she like to reschedule Pt's SVT ablation for June 21, 2020 first case.  Requested call back.

## 2020-06-14 NOTE — Telephone Encounter (Signed)
LMTCB

## 2020-06-18 MED ORDER — METOPROLOL SUCCINATE ER 25 MG PO TB24: 25.0000 mg | ORAL_TABLET | Freq: Two times a day (BID) | ORAL | 3 refills | Status: DC

## 2020-06-18 NOTE — Telephone Encounter (Signed)
Outreach made to Pt's sister.  Pt will be rescheduled for SVT ablation on April 18.  Will get labs/covid test on April 15

## 2020-06-18 NOTE — Telephone Encounter (Signed)
Work up complete  Location manager

## 2020-07-08 ENCOUNTER — Encounter (HOSPITAL_BASED_OUTPATIENT_CLINIC_OR_DEPARTMENT_OTHER): Payer: Self-pay | Admitting: Emergency Medicine

## 2020-07-08 ENCOUNTER — Other Ambulatory Visit: Payer: Self-pay

## 2020-07-08 ENCOUNTER — Emergency Department (HOSPITAL_BASED_OUTPATIENT_CLINIC_OR_DEPARTMENT_OTHER)
Admission: EM | Admit: 2020-07-08 | Discharge: 2020-07-08 | Disposition: A | Discharge status: Home / Self Care | Payer: Medicare HMO | Attending: Emergency Medicine | Admitting: Emergency Medicine

## 2020-07-08 DIAGNOSIS — I471 Supraventricular tachycardia: Secondary | ICD-10-CM

## 2020-07-08 DIAGNOSIS — E876 Hypokalemia: Secondary | ICD-10-CM | POA: Insufficient documentation

## 2020-07-08 DIAGNOSIS — R000 Tachycardia, unspecified: Secondary | ICD-10-CM | POA: Diagnosis present

## 2020-07-08 DIAGNOSIS — J45909 Unspecified asthma, uncomplicated: Secondary | ICD-10-CM | POA: Insufficient documentation

## 2020-07-08 LAB — BASIC METABOLIC PANEL
Anion gap: 10 (ref 5–15)
BUN: 24 mg/dL — ABNORMAL HIGH (ref 6–20)
CO2: 23 mmol/L (ref 22–32)
Calcium: 8.3 mg/dL — ABNORMAL LOW (ref 8.9–10.3)
Chloride: 102 mmol/L (ref 98–111)
Creatinine, Ser: 1.03 mg/dL (ref 0.61–1.24)
GFR, Estimated: >60 mL/min (ref >60)
Glucose, Bld: 85 mg/dL (ref 70–99)
Potassium: 3.4 mmol/L — ABNORMAL LOW (ref 3.5–5.1)
Sodium: 135 mmol/L (ref 135–145)

## 2020-07-08 MED ORDER — ADENOSINE 6 MG/2ML IV SOLN
6.0000 mg | Freq: Once | INTRAVENOUS | Status: AC
  Administered 2020-07-08: 6 mg via INTRAVENOUS
  Filled 2020-07-08: qty 2

## 2020-07-08 MED ORDER — POTASSIUM CHLORIDE CRYS ER 20 MEQ PO TBCR: 20.0000 meq | EXTENDED_RELEASE_TABLET | Freq: Every day | ORAL | 0 refills | Status: DC

## 2020-07-08 MED ORDER — ADENOSINE 6 MG/2ML IV SOLN
12.0000 mg | Freq: Once | INTRAVENOUS | Status: AC
  Administered 2020-07-08: 12 mg via INTRAVENOUS

## 2020-07-08 NOTE — ED Triage Notes (Signed)
Pt here with sister; sister reports pt c/o SHOB and fast HR started about 20 minutes ago; hx of same

## 2020-07-08 NOTE — ED Provider Notes (Signed)
[facility] EMERGENCY DEPARTMENT Provider Note   CSN: [id number] Arrival date & time: 07/08/20  1253     History Chief Complaint  Patient presents with  . Shortness of Breath  . Tachycardia    Daniel Summers is a 55 y.o. male.  HPI Level 5 caveat due to minimal verbal status.  Patient is deaf and autistic.  Patient sister provides the history. Patient has a history of SVT.  Due to have an ablation in 8 days.  However had another episode started on 20 minutes prior to arrival.  Felt heart racing.  Patient is on oral metoprolol.  Had had previous plan for ablation but had been delayed because he was sick.  Has been doing well otherwise.  Acting appropriately.    Past Medical History:  Diagnosis Date  . Asthma   . Autistic disorder of childhood onset   . Bilateral cataracts 05/02/2011  . Deaf   . History of blood transfusion    At [facility]  . Mental retardation 05/02/2011  . Osteoarthritis    right ankle  . Tachycardia     Patient Active Problem List   Diagnosis Date Noted  . Achilles tendinosis 01/17/2013  . OSA (obstructive sleep apnea) 12/08/2012  . Acute bronchitis 07/28/2012  . Unspecified asthma, with exacerbation 07/28/2012  . Pre-ulcerative corn or callous 05/05/2012  . Right shoulder pain 12/02/2011  . Low back pain 12/02/2011  . Bilateral ankle pain 12/02/2011  . Mental retardation 05/02/2011  . Bilateral cataracts 05/02/2011  . Asthma   . Osteoarthritis   . Preventative health care 04/27/2011  . Deaf     Past Surgical History:  Procedure Laterality Date  . EYE SURGERY    . HERNIA REPAIR    . INGUINAL HERNIA REPAIR         Family History  Problem Relation Age of Onset  . Diabetes Father   . Coronary artery disease Father   . Heart disease Other   . Hypertension Other   . Diabetes Other   . Alcohol abuse Other     Social History   Tobacco Use  . Smoking status: Never Smoker  . Smokeless tobacco: Never Used  Substance Use Topics   . Alcohol use: No  . Drug use: No    Home Medications Prior to Admission medications   Medication Sig Start Date End Date Taking? Authorizing Provider  potassium chloride SA (KLOR-CON) 20 MEQ tablet Take 1 tablet (20 mEq total) by mouth daily. 07/08/20  Yes Benjiman Core, MD  ibuprofen (ADVIL) 200 MG tablet Take 200-400 mg by mouth daily as needed (pain).    [provider]  metoprolol succinate (TOPROL XL) 25 MG 24 hr tablet Take 1 tablet (25 mg total) by mouth in the morning and at bedtime. 06/18/20   Lanier Prude, MD    Allergies    Patient has no known allergies.  Review of Systems   Review of Systems  Unable to perform ROS: Mental status change    Physical Exam Updated Vital Signs BP 107/63   Pulse 81   Temp 97.9 F (36.6 C) (Oral)   Resp 14   Wt (!) 146.7 kg   SpO2 99%   BMI 47.76 kg/m   Physical Exam Vitals and nursing note reviewed.  HENT:     Head: Atraumatic.  Cardiovascular:     Rate and Rhythm: Tachycardia present.  Pulmonary:     Effort: No respiratory distress.     Breath sounds:  No wheezing, rhonchi or rales.  Chest:     Chest wall: No tenderness.  Musculoskeletal:     Right lower leg: No tenderness.     Left lower leg: No tenderness.  Skin:    General: Skin is warm.     Capillary Refill: Capillary refill takes less than 2 seconds.  Neurological:     Mental Status: He is alert.     ED Results / Procedures / Treatments   Labs (all labs ordered are listed, but only abnormal results are displayed) Labs Reviewed  BASIC METABOLIC PANEL - Abnormal; Notable for the following components:      Result Value   Potassium 3.4 (*)    BUN 24 (*)    Calcium 8.3 (*)    All other components within normal limits    EKG EKG Interpretation  Date/Time:  Sunday July 08 2020 13:26:32 EDT Ventricular Rate:  82 PR Interval:  163 QRS Duration: 110 QT Interval:  391 QTC Calculation: 457 R Axis:   96 Text Interpretation: Sinus rhythm  Consider right ventricular hypertrophy SVT has resolved Confirmed by Andyn Sales ([id number]) on 07/08/2020 2:32:58 PM   Radiology No results found.  Procedures .Cardioversion  Date/Time: 07/08/2020 2:43 PM Performed by: Maegen Wigle, MD Authorized by: Natilie Krabbenhoft, MD       Medications Ordered in ED Medications  adenosine (ADENOCARD) 6 MG/2ML injection 6 mg (6 mg Intravenous Given 07/08/20 1318)  adenosine (ADENOCARD) 6 MG/2ML injection 12 mg (12 mg Intravenous Given 07/08/20 1321)    ED Course  I have reviewed the triage vital signs and the nursing notes.  Pertinent labs & imaging results that were available during my care of the patient were reviewed by me and considered in my medical decision making (see chart for details).    MDM Rules/Calculators/A&P                          Patient presents in SVt. History of same. Reviewing records. Has had some hypokalemia in the past. Mildly decreased now. Will supplement. Has ablation planned in 8 days. Cardioverted wiith adenosine. D/c home in sinus rhythm   Final Clinical Impression(s) / ED Diagnoses Final diagnoses:  SVT (supraventricular tachycardia) (HCC)  Hypokalemia    Rx / DC Orders ED Discharge Orders         Ordered    potassium chloride SA (KLOR-CON) 20 MEQ tablet  Daily        04 /10/22 1431           01-18-1982, MD 07/08/20 1444

## 2020-07-10 ENCOUNTER — Ambulatory Visit: Payer: Medicare HMO | Admitting: Cardiology

## 2020-07-13 ENCOUNTER — Other Ambulatory Visit: Payer: Self-pay

## 2020-07-13 ENCOUNTER — Other Ambulatory Visit (HOSPITAL_COMMUNITY): Payer: Medicare HMO

## 2020-07-13 ENCOUNTER — Other Ambulatory Visit: Payer: Medicare HMO | Admitting: *Deleted

## 2020-07-13 DIAGNOSIS — I471 Supraventricular tachycardia: Secondary | ICD-10-CM

## 2020-07-13 NOTE — Pre-Procedure Instructions (Signed)
Instructed patient on the following items: Arrival time 1130 Nothing to eat or drink after midnight No meds AM of procedure Responsible person to drive you home and stay with you for 24 hrs   

## 2020-07-14 LAB — BASIC METABOLIC PANEL
BUN/Creatinine Ratio: 15 (ref 9–20)
BUN: 17 mg/dL (ref 6–24)
CO2: 21 mmol/L (ref 20–29)
Calcium: 9.3 mg/dL (ref 8.7–10.2)
Chloride: 103 mmol/L (ref 96–106)
Creatinine, Ser: 1.14 mg/dL (ref 0.76–1.27)
Glucose: 80 mg/dL (ref 65–99)
Potassium: 4.3 mmol/L (ref 3.5–5.2)
Sodium: 139 mmol/L (ref 134–144)
eGFR: 75 mL/min/{1.73_m2} (ref >59)

## 2020-07-14 LAB — CBC WITH DIFFERENTIAL/PLATELET
Basophils Absolute: 0.1 10*3/uL (ref 0.0–0.2)
Basos: 1 %
EOS (ABSOLUTE): 0.3 10*3/uL (ref 0.0–0.4)
Eos: 4 %
Hematocrit: 47.1 % (ref 37.5–51.0)
Hemoglobin: 16 g/dL (ref 13.0–17.7)
Immature Grans (Abs): 0 10*3/uL (ref 0.0–0.1)
Immature Granulocytes: 1 %
Lymphocytes Absolute: 1.3 10*3/uL (ref 0.7–3.1)
Lymphs: 17 %
MCH: 30.7 pg (ref 26.6–33.0)
MCHC: 34 g/dL (ref 31.5–35.7)
MCV: 90 fL (ref 79–97)
Monocytes Absolute: 1 10*3/uL — ABNORMAL HIGH (ref 0.1–0.9)
Monocytes: 13 %
Neutrophils Absolute: 5.1 10*3/uL (ref 1.4–7.0)
Neutrophils: 64 %
Platelets: 324 10*3/uL (ref 150–450)
RBC: 5.21 x10E6/uL (ref 4.14–5.80)
RDW: 12 % (ref 11.6–15.4)
WBC: 7.8 10*3/uL (ref 3.4–10.8)

## 2020-07-16 ENCOUNTER — Ambulatory Visit (HOSPITAL_COMMUNITY)
Admission: RE | Admit: 2020-07-16 | Discharge: 2020-07-16 | Disposition: A | Discharge status: Home / Self Care | Payer: Medicare HMO | Attending: Cardiology | Admitting: Cardiology

## 2020-07-16 ENCOUNTER — Encounter (HOSPITAL_COMMUNITY)
Admission: RE | Disposition: A | Discharge status: Home / Self Care | Payer: Self-pay | Source: Home / Self Care | Attending: Cardiology

## 2020-07-16 ENCOUNTER — Other Ambulatory Visit: Payer: Self-pay

## 2020-07-16 ENCOUNTER — Encounter (HOSPITAL_COMMUNITY): Payer: Self-pay | Admitting: Anesthesiology

## 2020-07-16 ENCOUNTER — Encounter (HOSPITAL_COMMUNITY): Payer: Self-pay | Admitting: Cardiology

## 2020-07-16 DIAGNOSIS — Z20822 Contact with and (suspected) exposure to covid-19: Secondary | ICD-10-CM | POA: Diagnosis not present

## 2020-07-16 DIAGNOSIS — Z539 Procedure and treatment not carried out, unspecified reason: Secondary | ICD-10-CM | POA: Insufficient documentation

## 2020-07-16 LAB — SARS CORONAVIRUS 2 BY RT PCR (HOSPITAL ORDER, PERFORMED IN ~~LOC~~ HOSPITAL LAB): SARS Coronavirus 2: NEGATIVE

## 2020-07-16 SURGERY — SVT ABLATION
Anesthesia: General

## 2020-07-16 MED ORDER — NYSTATIN 100000 UNIT/GM EX POWD: 1.0000 "application " | Freq: Three times a day (TID) | CUTANEOUS | 2 refills | Status: DC

## 2020-07-16 MED ORDER — SODIUM CHLORIDE 0.9 % IV SOLN: INTRAVENOUS | Status: DC

## 2020-07-16 MED ORDER — METOPROLOL SUCCINATE ER 50 MG PO TB24: 50.0000 mg | ORAL_TABLET | Freq: Two times a day (BID) | ORAL | 11 refills | Status: DC

## 2020-07-16 NOTE — Anesthesia Preprocedure Evaluation (Deleted)
Anesthesia Evaluation  Patient identified by MRN, date of birth, ID band Patient awake    Reviewed: Allergy & Precautions, NPO status , Patient's Chart, lab work & pertinent test results  History of Anesthesia Complications Negative for: history of anesthetic complications  Airway Mallampati: II  TM Distance: >3 FB Neck ROM: Full    Dental  (+) Teeth Intact, Dental Advisory Given   Pulmonary asthma , sleep apnea ,    Pulmonary exam normal breath sounds clear to auscultation       Cardiovascular Normal cardiovascular exam+ dysrhythmias Supra Ventricular Tachycardia  Rhythm:Regular Rate:Normal     Neuro/Psych PSYCHIATRIC DISORDERS  Autism Spectrum d/o  Deaf     GI/Hepatic negative GI ROS, Neg liver ROS,   Endo/Other  Morbid obesity  Renal/GU negative Renal ROS     Musculoskeletal  (+) Arthritis ,   Abdominal   Peds  (+) mental retardation Hematology negative hematology ROS (+)   Anesthesia Other Findings   Reproductive/Obstetrics                            Anesthesia Physical Anesthesia Plan  ASA: III  Anesthesia Plan: General   Post-op Pain Management:    Induction: Intravenous  PONV Risk Score and Plan: 2 and Treatment may vary due to age or medical condition, Ondansetron and Dexamethasone  Airway Management Planned: Oral ETT  Additional Equipment: None  Intra-op Plan:   Post-operative Plan: Extubation in OR  Informed Consent: I have reviewed the patients History and Physical, chart, labs and discussed the procedure including the risks, benefits and alternatives for the proposed anesthesia with the patient or authorized representative who has indicated his/her understanding and acceptance.     Dental advisory given, History available from chart only and Consent reviewed with POA  Plan Discussed with: CRNA and Anesthesiologist  Anesthesia Plan Comments:        Anesthesia Quick Evaluation

## 2020-07-16 NOTE — Progress Notes (Signed)
Pt case cancelled due to bilateral groin rash, Dr Lalla Brothers reviewed and will have rescheduled.

## 2020-07-25 ENCOUNTER — Encounter (HOSPITAL_BASED_OUTPATIENT_CLINIC_OR_DEPARTMENT_OTHER): Payer: Self-pay | Admitting: Emergency Medicine

## 2020-07-25 ENCOUNTER — Other Ambulatory Visit: Payer: Self-pay

## 2020-07-25 ENCOUNTER — Emergency Department (HOSPITAL_BASED_OUTPATIENT_CLINIC_OR_DEPARTMENT_OTHER)
Admission: EM | Admit: 2020-07-25 | Discharge: 2020-07-25 | Disposition: A | Discharge status: Home / Self Care | Payer: Medicare HMO | Attending: Emergency Medicine | Admitting: Emergency Medicine

## 2020-07-25 DIAGNOSIS — F840 Autistic disorder: Secondary | ICD-10-CM | POA: Insufficient documentation

## 2020-07-25 DIAGNOSIS — J45909 Unspecified asthma, uncomplicated: Secondary | ICD-10-CM | POA: Insufficient documentation

## 2020-07-25 DIAGNOSIS — B372 Candidiasis of skin and nail: Secondary | ICD-10-CM | POA: Insufficient documentation

## 2020-07-25 DIAGNOSIS — R21 Rash and other nonspecific skin eruption: Secondary | ICD-10-CM | POA: Diagnosis present

## 2020-07-25 DIAGNOSIS — L304 Erythema intertrigo: Secondary | ICD-10-CM | POA: Diagnosis not present

## 2020-07-25 MED ORDER — CLOTRIMAZOLE 1 % EX CREA: TOPICAL_CREAM | CUTANEOUS | 0 refills | Status: AC

## 2020-07-25 NOTE — ED Provider Notes (Signed)
[facility] EMERGENCY DEPARTMENT Provider Note   CSN: [id number] Arrival date & time: 07/25/20  7425  LEVEL 5 CAVEAT - NONVERBAL History Chief Complaint  Patient presents with  . Rash    Daniel Summers is a 55 y.o. male.  HPI 55 year old male presents with a worsening groin rash.  History is limited as the patient is deaf and autistic so the history is taken from the sister at the bedside.  This rash was first found over a week ago when he was being set up for an ablation and they prepped his groin and noticed a rash.  He was prescribed nystatin powder.  Since then they have been using it but it seems to be worsening.  The patient is not incontinent and does not wear diapers.  However he is obese and has significant skin folds which limits the healing.  Otherwise, the sister has not noticed any systemic symptoms and the patient does not complain about it or seem to notice it.  Past Medical History:  Diagnosis Date  . Asthma   . Autistic disorder of childhood onset   . Bilateral cataracts 05/02/2011  . Deaf   . History of blood transfusion    At [facility]  . Mental retardation 05/02/2011  . Osteoarthritis    right ankle  . Tachycardia     Patient Active Problem List   Diagnosis Date Noted  . Achilles tendinosis 01/17/2013  . OSA (obstructive sleep apnea) 12/08/2012  . Acute bronchitis 07/28/2012  . Unspecified asthma, with exacerbation 07/28/2012  . Pre-ulcerative corn or callous 05/05/2012  . Right shoulder pain 12/02/2011  . Low back pain 12/02/2011  . Bilateral ankle pain 12/02/2011  . Mental retardation 05/02/2011  . Bilateral cataracts 05/02/2011  . Asthma   . Osteoarthritis   . Preventative health care 04/27/2011  . Deaf     Past Surgical History:  Procedure Laterality Date  . EYE SURGERY    . HERNIA REPAIR    . INGUINAL HERNIA REPAIR         Family History  Problem Relation Age of Onset  . Diabetes Father   . Coronary artery disease Father   .  Heart disease Other   . Hypertension Other   . Diabetes Other   . Alcohol abuse Other     Social History   Tobacco Use  . Smoking status: Never Smoker  . Smokeless tobacco: Never Used  Substance Use Topics  . Alcohol use: No  . Drug use: No    Home Medications Prior to Admission medications   Medication Sig Start Date End Date Taking? Authorizing Provider  clotrimazole (LOTRIMIN) 1 % cream Apply to affected area 2 times daily for up to 4 weeks 07/25/20  Yes Pricilla Loveless, MD  ibuprofen (ADVIL) 200 MG tablet Take 200-400 mg by mouth daily as needed (pain).    [provider]  metoprolol succinate (TOPROL XL) 50 MG 24 hr tablet Take 1 tablet (50 mg total) by mouth 2 (two) times daily. Take with or immediately following a meal. 07/16/20 07/16/21  Tillery, Mariam Dollar, PA-C  potassium chloride SA (KLOR-CON) 20 MEQ tablet Take 1 tablet (20 mEq total) by mouth daily. 07/08/20   Benjiman Core, MD    Allergies    Patient has no known allergies.  Review of Systems   Review of Systems  Unable to perform ROS: Patient nonverbal    Physical Exam Updated Vital Signs BP (!) 109/50 (BP Location: Right Arm)  Pulse (!) 58   Temp 97.6 F (36.4 C) (Oral)   Resp 16   Ht 5\' 9"  (1.753 m)   Wt (!) 146.7 kg   SpO2 100%   BMI 47.76 kg/m   Physical Exam Vitals and nursing note reviewed.  Constitutional:      Appearance: He is well-developed. He is obese.  HENT:     Head: Normocephalic and atraumatic.     Right Ear: External ear normal.     Left Ear: External ear normal.     Nose: Nose normal.  Eyes:     General:        Right eye: No discharge.        Left eye: No discharge.  Pulmonary:     Effort: Pulmonary effort is normal.  Abdominal:     Palpations: Abdomen is soft.     Tenderness: There is no abdominal tenderness.  Skin:    General: Skin is warm and dry.     Comments: Extensive bilateral groin rash that is dark red with satellite lesions c/w fungal infection.  No obvious superimposed cellulitis. This is all under pannus  Neurological:     Mental Status: He is alert.  Psychiatric:        Mood and Affect: Mood is not anxious.     ED Results / Procedures / Treatments   Labs (all labs ordered are listed, but only abnormal results are displayed) Labs Reviewed - No data to display  EKG None  Radiology No results found.  Procedures Procedures   Medications Ordered in ED Medications - No data to display  ED Course  I have reviewed the triage vital signs and the nursing notes.  Pertinent labs & imaging results that were available during my care of the patient were reviewed by me and considered in my medical decision making (see chart for details).    MDM Rules/Calculators/A&P                          Exam is consistent with Candida intertrigo.  Nystatin typically would not be as beneficial in this area so I will prescribe Lotrimin.  We discussed and I have provided other supportive care such as cleaning the area daily, getting it dry and aerating, etc.  Will need to follow-up with PCP. Final Clinical Impression(s) / ED Diagnoses Final diagnoses:  Candidal intertrigo    Rx / DC Orders ED Discharge Orders         Ordered    clotrimazole (LOTRIMIN) 1 % cream        07/25/20 0717           07/27/20, MD 07/25/20 [phone]

## 2020-07-25 NOTE — ED Notes (Signed)
Reviewed d/c paperwork with pt sister -  Reviewed medications

## 2020-07-25 NOTE — ED Triage Notes (Signed)
Patient presents with rash under abd fold. States onset 1-2 weeks; seen for preop for procedure and given powder with no improvement. Procedure rescheduled due to rash.

## 2020-07-25 NOTE — Discharge Instructions (Addendum)
Practices aimed at minimizing moisture and friction in the involved area and reducing susceptibility to intertrigo are the mainstays of treatment. Typical beneficial practices include:  ?Daily cleansing of intertriginous skin with a mild cleanser followed by drying of affected area with a hair dryer on a cool setting  ?Aeration of affected area when feasible  ?Daily application of drying powders, such as powders composed of microporous cellulose  ?Use of absorbent material or clothing, such as cotton or merino wool, to separate skin in folds  ?Application of barrier creams in areas that may come in contact with urine or feces  ?Weight loss in persons who are overweight or obese

## 2020-07-26 ENCOUNTER — Telehealth: Payer: Self-pay

## 2020-07-26 NOTE — Telephone Encounter (Signed)
SVT ablation cancelled d/t bilateral fungal rash.  Will need to reschedule.

## 2020-08-02 DIAGNOSIS — H9193 Unspecified hearing loss, bilateral: Secondary | ICD-10-CM | POA: Diagnosis not present

## 2020-08-02 DIAGNOSIS — Z008 Encounter for other general examination: Secondary | ICD-10-CM | POA: Diagnosis not present

## 2020-08-02 DIAGNOSIS — B379 Candidiasis, unspecified: Secondary | ICD-10-CM | POA: Diagnosis not present

## 2020-08-02 DIAGNOSIS — Z0000 Encounter for general adult medical examination without abnormal findings: Secondary | ICD-10-CM | POA: Diagnosis not present

## 2020-08-02 DIAGNOSIS — F840 Autistic disorder: Secondary | ICD-10-CM | POA: Diagnosis not present

## 2020-08-02 DIAGNOSIS — Z6841 Body Mass Index (BMI) 40.0 and over, adult: Secondary | ICD-10-CM | POA: Diagnosis not present

## 2020-08-02 DIAGNOSIS — I471 Supraventricular tachycardia: Secondary | ICD-10-CM | POA: Diagnosis not present

## 2020-08-09 DIAGNOSIS — G4733 Obstructive sleep apnea (adult) (pediatric): Secondary | ICD-10-CM | POA: Diagnosis not present

## 2020-08-13 ENCOUNTER — Encounter (HOSPITAL_BASED_OUTPATIENT_CLINIC_OR_DEPARTMENT_OTHER): Payer: Self-pay | Admitting: Emergency Medicine

## 2020-08-13 ENCOUNTER — Other Ambulatory Visit: Payer: Self-pay

## 2020-08-13 ENCOUNTER — Emergency Department (HOSPITAL_BASED_OUTPATIENT_CLINIC_OR_DEPARTMENT_OTHER)
Admission: EM | Admit: 2020-08-13 | Discharge: 2020-08-14 | Disposition: A | Discharge status: Home / Self Care | Payer: Medicare HMO | Attending: Emergency Medicine | Admitting: Emergency Medicine

## 2020-08-13 DIAGNOSIS — L304 Erythema intertrigo: Secondary | ICD-10-CM | POA: Diagnosis not present

## 2020-08-13 DIAGNOSIS — F840 Autistic disorder: Secondary | ICD-10-CM | POA: Insufficient documentation

## 2020-08-13 DIAGNOSIS — J45909 Unspecified asthma, uncomplicated: Secondary | ICD-10-CM | POA: Insufficient documentation

## 2020-08-13 DIAGNOSIS — R002 Palpitations: Secondary | ICD-10-CM | POA: Diagnosis present

## 2020-08-13 DIAGNOSIS — H61893 Other specified disorders of external ear, bilateral: Secondary | ICD-10-CM | POA: Diagnosis not present

## 2020-08-13 DIAGNOSIS — I471 Supraventricular tachycardia: Secondary | ICD-10-CM | POA: Diagnosis not present

## 2020-08-13 DIAGNOSIS — E559 Vitamin D deficiency, unspecified: Secondary | ICD-10-CM | POA: Diagnosis not present

## 2020-08-13 DIAGNOSIS — R000 Tachycardia, unspecified: Secondary | ICD-10-CM | POA: Diagnosis not present

## 2020-08-13 DIAGNOSIS — Z79899 Other long term (current) drug therapy: Secondary | ICD-10-CM | POA: Diagnosis not present

## 2020-08-13 DIAGNOSIS — Z008 Encounter for other general examination: Secondary | ICD-10-CM | POA: Diagnosis not present

## 2020-08-13 DIAGNOSIS — H9193 Unspecified hearing loss, bilateral: Secondary | ICD-10-CM | POA: Diagnosis not present

## 2020-08-13 DIAGNOSIS — Z0001 Encounter for general adult medical examination with abnormal findings: Secondary | ICD-10-CM | POA: Diagnosis not present

## 2020-08-13 DIAGNOSIS — B379 Candidiasis, unspecified: Secondary | ICD-10-CM | POA: Diagnosis not present

## 2020-08-13 MED ORDER — ADENOSINE 6 MG/2ML IV SOLN
INTRAVENOUS | Status: AC
  Administered 2020-08-13: 12 mg
  Filled 2020-08-13: qty 2

## 2020-08-13 NOTE — ED Notes (Signed)
Dr. Wilkie Aye at bedside. Patient attached to zoll monitor and pads. Adenosine given, pt converted into NSR. No acute distress noted.

## 2020-08-13 NOTE — ED Triage Notes (Signed)
Pt has hx of SVT, was at  Home and noticed HR elevated and alerted caregiver(sister). Pt is deaf and autistic.

## 2020-08-14 ENCOUNTER — Ambulatory Visit: Payer: Medicare HMO | Admitting: Cardiology

## 2020-08-14 ENCOUNTER — Emergency Department (HOSPITAL_BASED_OUTPATIENT_CLINIC_OR_DEPARTMENT_OTHER): Payer: Medicare HMO

## 2020-08-14 DIAGNOSIS — I471 Supraventricular tachycardia: Secondary | ICD-10-CM | POA: Diagnosis not present

## 2020-08-14 DIAGNOSIS — R000 Tachycardia, unspecified: Secondary | ICD-10-CM | POA: Diagnosis not present

## 2020-08-14 LAB — CBC WITH DIFFERENTIAL/PLATELET
Abs Immature Granulocytes: 0.03 10*3/uL (ref 0.00–0.07)
Basophils Absolute: 0.1 10*3/uL (ref 0.0–0.1)
Basophils Relative: 1 %
Eosinophils Absolute: 0.5 10*3/uL (ref 0.0–0.5)
Eosinophils Relative: 5 %
HCT: 47.4 % (ref 39.0–52.0)
Hemoglobin: 16.2 g/dL (ref 13.0–17.0)
Immature Granulocytes: 0 %
Lymphocytes Relative: 14 %
Lymphs Abs: 1.3 10*3/uL (ref 0.7–4.0)
MCH: 31.3 pg (ref 26.0–34.0)
MCHC: 34.2 g/dL (ref 30.0–36.0)
MCV: 91.7 fL (ref 80.0–100.0)
Monocytes Absolute: 1.2 10*3/uL — ABNORMAL HIGH (ref 0.1–1.0)
Monocytes Relative: 13 %
Neutro Abs: 6.3 10*3/uL (ref 1.7–7.7)
Neutrophils Relative %: 67 %
Platelets: 302 10*3/uL (ref 150–400)
RBC: 5.17 MIL/uL (ref 4.22–5.81)
RDW: 12.9 % (ref 11.5–15.5)
WBC: 9.4 10*3/uL (ref 4.0–10.5)
nRBC: 0 % (ref 0.0–0.2)

## 2020-08-14 LAB — BASIC METABOLIC PANEL
Anion gap: 10 (ref 5–15)
BUN: 24 mg/dL — ABNORMAL HIGH (ref 6–20)
CO2: 22 mmol/L (ref 22–32)
Calcium: 9.1 mg/dL (ref 8.9–10.3)
Chloride: 104 mmol/L (ref 98–111)
Creatinine, Ser: 1.16 mg/dL (ref 0.61–1.24)
GFR, Estimated: >60 mL/min (ref >60)
Glucose, Bld: 90 mg/dL (ref 70–99)
Potassium: 3.9 mmol/L (ref 3.5–5.1)
Sodium: 136 mmol/L (ref 135–145)

## 2020-08-14 LAB — TROPONIN I (HIGH SENSITIVITY): Troponin I (High Sensitivity): 4 ng/L (ref <18)

## 2020-08-14 NOTE — ED Provider Notes (Signed)
[facility] EMERGENCY DEPARTMENT Provider Note   CSN: [id number] Arrival date & time: 08/13/20  2257     History Chief Complaint  Patient presents with  . Tachycardia    Daniel Summers is a 55 y.o. male.  HPI     This is a 55 year old male with a history of autism, deafness, MR who presents with his sister with concerns for SVT.  Patient has a history of SVT.  He is due for ablation; however he is currently being treated for an inguinal yeast infection with fluconazole.  He does not verbally communicate but indicated to his sister that he was having palpitations and brought his pulse ox.  She states that this is what he normally does when he is in SVT.  He has required adenosine multiple times.  She does report that they attempted to do vagal maneuvers at home with minimal relief.  No recent changes in medications with the exception of addition of fluconazole.  No increased caffeine use or dietary changes.  History limited secondary to patient's autism and MRI.  Majority of history taken from sister.  Level 5 caveat.  Cardiology history reviewed.  Patient of Dr. Lalla Brothers.  Required adenosine cardioversion  Past Medical History:  Diagnosis Date  . Asthma   . Autistic disorder of childhood onset   . Bilateral cataracts 05/02/2011  . Deaf   . History of blood transfusion    At [facility]  . Mental retardation 05/02/2011  . Osteoarthritis    right ankle  . Tachycardia     Patient Active Problem List   Diagnosis Date Noted  . Achilles tendinosis 01/17/2013  . OSA (obstructive sleep apnea) 12/08/2012  . Acute bronchitis 07/28/2012  . Unspecified asthma, with exacerbation 07/28/2012  . Pre-ulcerative corn or callous 05/05/2012  . Right shoulder pain 12/02/2011  . Low back pain 12/02/2011  . Bilateral ankle pain 12/02/2011  . Mental retardation 05/02/2011  . Bilateral cataracts 05/02/2011  . Asthma   . Osteoarthritis   . Preventative health care 04/27/2011  . Deaf      Past Surgical History:  Procedure Laterality Date  . EYE SURGERY    . HERNIA REPAIR    . INGUINAL HERNIA REPAIR         Family History  Problem Relation Age of Onset  . Diabetes Father   . Coronary artery disease Father   . Heart disease Other   . Hypertension Other   . Diabetes Other   . Alcohol abuse Other     Social History   Tobacco Use  . Smoking status: Never Smoker  . Smokeless tobacco: Never Used  Substance Use Topics  . Alcohol use: No  . Drug use: No    Home Medications Prior to Admission medications   Medication Sig Start Date End Date Taking? Authorizing Provider  clotrimazole (LOTRIMIN) 1 % cream Apply to affected area 2 times daily for up to 4 weeks 07/25/20   Pricilla Loveless, MD  fluconazole (DIFLUCAN) 150 MG tablet Take 150 mg by mouth once. 08/02/20   [provider]  ibuprofen (ADVIL) 200 MG tablet Take 200-400 mg by mouth daily as needed (pain).    [provider]  metoprolol succinate (TOPROL XL) 50 MG 24 hr tablet Take 1 tablet (50 mg total) by mouth 2 (two) times daily. Take with or immediately following a meal. 07/16/20 07/16/21  Tillery, Mariam Dollar, PA-C  potassium chloride SA (KLOR-CON) 20 MEQ tablet Take 1 tablet (20 mEq total)  by mouth daily. 07/08/20   Benjiman Core, MD    Allergies    Patient has no known allergies.  Review of Systems   Review of Systems  Unable to perform ROS: Patient nonverbal  Cardiovascular: Positive for palpitations.    Physical Exam Updated Vital Signs BP (!) 97/48   Pulse 62   Resp 17   Ht 1.753 m (5\' 9" )   Wt (!) 146 kg   SpO2 96%   BMI 47.53 kg/m   Physical Exam Vitals and nursing note reviewed.  Constitutional:      Appearance: He is well-developed. He is obese.  HENT:     Head: Normocephalic and atraumatic.     Nose: Nose normal.     Mouth/Throat:     Mouth: Mucous membranes are moist.  Eyes:     Pupils: Pupils are equal, round, and reactive to light.   Cardiovascular:     Rate and Rhythm: Regular rhythm. Tachycardia present.     Heart sounds: Normal heart sounds. No murmur heard.   Pulmonary:     Effort: Pulmonary effort is normal. No respiratory distress.     Breath sounds: Normal breath sounds. No wheezing.  Abdominal:     General: Bowel sounds are normal.     Palpations: Abdomen is soft.     Tenderness: There is no abdominal tenderness. There is no rebound.  Musculoskeletal:     Cervical back: Neck supple.     Comments: Trace bilateral lower extremity edema  Lymphadenopathy:     Cervical: No cervical adenopathy.  Skin:    General: Skin is warm and dry.  Neurological:     Mental Status: He is alert.     Comments: Unable to assess orientation  Psychiatric:        Mood and Affect: Mood normal.     ED Results / Procedures / Treatments   Labs (all labs ordered are listed, but only abnormal results are displayed) Labs Reviewed  CBC WITH DIFFERENTIAL/PLATELET - Abnormal; Notable for the following components:      Result Value   Monocytes Absolute 1.2 (*)    All other components within normal limits  BASIC METABOLIC PANEL - Abnormal; Notable for the following components:   BUN 24 (*)    All other components within normal limits  TROPONIN I (HIGH SENSITIVITY)  TROPONIN I (HIGH SENSITIVITY)    EKG EKG Interpretation  Date/Time:  Monday Aug 13 2020 23:04:03 EDT Ventricular Rate:  162 PR Interval:  233 QRS Duration: 99 QT Interval:  287 QTC Calculation: 472 R Axis:   85 Text Interpretation: Supraventricular tachycardia Prolonged PR interval LAE, consider biatrial enlargement RSR' in V1 or V2, probably normal variant Repolarization abnormality, prob rate related Confirmed by [id number] (Ross Marcus) on 08/14/2020 12:04:05 AM   EKG Interpretation  Date/Time:  Monday Aug 13 2020 23:18:33 EDT Ventricular Rate:  81 PR Interval:  189 QRS Duration: 111 QT Interval:  370 QTC Calculation: 430 R Axis:   84 Text  Interpretation: Sinus rhythm Probable left atrial enlargement RSR' in V1 or V2, probably normal variant Confirmed by [id number] (Ross Marcus) on 08/14/2020 1:29:44 AM       Radiology DG Chest Portable 1 View  Result Date: 08/14/2020 CLINICAL DATA:  Tachycardia EXAM: PORTABLE CHEST 1 VIEW COMPARISON:  05/15/2020 FINDINGS: The heart size and mediastinal contours are within normal limits. Both lungs are clear. The visualized skeletal structures are unremarkable. IMPRESSION: No active disease. Electronically Signed   By: 05/17/2020  M.D.   On: 08/14/2020 00:40    Procedures .Critical Care Performed by: Shon Baton, MD Authorized by: Shon Baton, MD   Critical care provider statement:    Critical care time (minutes):  45   Critical care was necessary to treat or prevent imminent or life-threatening deterioration of the following conditions: Arrhythmia.   Critical care was time spent personally by me on the following activities:  Discussions with consultants, evaluation of patient's response to treatment, examination of patient, ordering and performing treatments and interventions, ordering and review of laboratory studies, ordering and review of radiographic studies, pulse oximetry, re-evaluation of patient's condition, obtaining history from patient or surrogate and review of old charts .Cardioversion  Date/Time: 08/14/2020 1:32 AM Performed by: Shon Baton, MD Authorized by: Shon Baton, MD   Consent:    Consent obtained:  Verbal   Consent given by: sister.   Risks discussed:  Induced arrhythmia   Alternatives discussed:  No treatment Pre-procedure details:    Cardioversion basis:  Emergent   Rhythm:  Supraventricular tachycardia   Electrode placement:  Anterior-posterior Patient sedated: No Attempt one:    Cardioversion mode attempt one: Adenosine.   Shock outcome:  Conversion to normal sinus rhythm Post-procedure details:    Patient status:  Awake    Patient tolerance of procedure:  Tolerated well, no immediate complications     Medications Ordered in ED Medications  adenosine (ADENOCARD) 6 MG/2ML injection (12 mg  Given 08/13/20 2318)    ED Course  I have reviewed the triage vital signs and the nursing notes.  Pertinent labs & imaging results that were available during my care of the patient were reviewed by me and considered in my medical decision making (see chart for details).    MDM Rules/Calculators/A&P                          Patient presents with palpitations.  History is provided primarily by the sister.  He is overall nontoxic-appearing.  EKG is consistent with SVT.  His initial blood pressure low at 78/51.  He has a history of SVT in the past.  He was converted successfully with 12 mg of adenosine on first attempt.  Labs including metabolites were obtained and are largely reassuring.  Chest x-ray without any pneumothorax or pneumonia.  Repeat EKG with normal sinus rhythm.  Patient appears stable.  We will plan for discharge with follow-up with his primary cardiologist.  Blood pressures stabilized following conversion.  After history, exam, and medical workup I feel the patient has been appropriately medically screened and is safe for discharge home. Pertinent diagnoses were discussed with the patient. Patient was given return precautions.  Final Clinical Impression(s) / ED Diagnoses Final diagnoses:  SVT (supraventricular tachycardia) (HCC)    Rx / DC Orders ED Discharge Orders    None       Kase Shughart, Mayer Masker, MD 08/14/20 [id number]

## 2020-08-14 NOTE — Progress Notes (Deleted)
Electrophysiology Office Follow up Visit Note:    Date:  08/14/2020   ID:  Daniel Summers, DOB Aug 28, 1965, MRN [id number]  PCP:  Patient, No Pcp Per (Inactive)  [facility] Cardiologist:  None  [facility] Electrophysiologist:  Lanier Prude, MD    Interval History:    Daniel Summers is a 55 y.o. male who presents for a follow up visit.  I last saw the patient when he presented for his EP study on July 16, 2020.  Unfortunately, the procedure had to be canceled due to an extensive candidal rash beneath his abdominal skin fold.  This has been worsening since we first identified it.     Past Medical History:  Diagnosis Date  . Asthma   . Autistic disorder of childhood onset   . Bilateral cataracts 05/02/2011  . Deaf   . History of blood transfusion    At [facility]  . Mental retardation 05/02/2011  . Osteoarthritis    right ankle  . Tachycardia     Past Surgical History:  Procedure Laterality Date  . EYE SURGERY    . HERNIA REPAIR    . INGUINAL HERNIA REPAIR      Current Medications: No outpatient medications have been marked as taking for the 08/14/20 encounter (Appointment) with Lanier Prude, MD.     Allergies:   Patient has no known allergies.   Social History   Socioeconomic History  . Marital status: Single    Spouse name: Not on file  . Number of children: Not on file  . Years of education: Not on file  . Highest education level: Not on file  Occupational History  . Not on file  Tobacco Use  . Smoking status: Never Smoker  . Smokeless tobacco: Never Used  Vaping Use  . Vaping Use: Not on file  Substance and Sexual Activity  . Alcohol use: No  . Drug use: No  . Sexual activity: Not on file  Other Topics Concern  . Not on file  Social History Narrative  . Not on file   Social Determinants of Health   Financial Resource Strain: Not on file  Food Insecurity: Not on file  Transportation Needs: Not on file  Physical Activity: Not on file   Stress: Not on file  Social Connections: Not on file     Family History: The patient's family history includes Alcohol abuse in an other family member; Coronary artery disease in his father; Diabetes in his father and another family member; Heart disease in an other family member; Hypertension in an other family member.  ROS:   Please see the history of present illness.    All other systems reviewed and are negative.  EKGs/Labs/Other Studies Reviewed:    The following studies were reviewed today:  Aug 13, 2020 EKG   EKG:  The ekg ordered today demonstrates ***  Recent Labs: 03/15/2020: TSH 5.202 05/01/2020: Magnesium 1.9 05/15/2020: ALT 22 08/14/2020: BUN 24; Creatinine, Ser 1.16; Hemoglobin 16.2; Platelets 302; Potassium 3.9; Sodium 136  Recent Lipid Panel    Component Value Date/Time   CHOL 245 (H) 05/05/2012 1358   TRIG 151.0 (H) 05/05/2012 1358   HDL 28.70 (L) 05/05/2012 1358   CHOLHDL 9 05/05/2012 1358   VLDL 30.2 05/05/2012 1358   LDLCALC 124 (H) 05/02/2011 1338   LDLDIRECT 187.2 05/05/2012 1358    Physical Exam:    VS:  There were no vitals taken for this visit.    Wt Readings from  Last 3 Encounters:  08/13/20 (!) 321 lb 14 oz (146 kg)  07/25/20 (!) 323 lb 6.6 oz (146.7 kg)  07/16/20 (!) 323 lb 6.6 oz (146.7 kg)     GEN: *** Well nourished, well developed in no acute distress HEENT: Normal NECK: No JVD; No carotid bruits LYMPHATICS: No lymphadenopathy CARDIAC: ***RRR, no murmurs, rubs, gallops RESPIRATORY:  Clear to auscultation without rales, wheezing or rhonchi  ABDOMEN: Soft, non-tender, non-distended MUSCULOSKELETAL:  No edema; No deformity  SKIN: Warm and dry NEUROLOGIC:  Alert and oriented x 3 PSYCHIATRIC:  Normal affect   ASSESSMENT:    1. Narrow complex tachycardia (HCC)    PLAN:    In order of problems listed above:  1. Recurrent symptomatic SVT  Total time spent with patient today *** minutes. This includes reviewing records,  evaluating the patient and coordinating care.   Medication Adjustments/Labs and Tests Ordered: Current medicines are reviewed at length with the patient today.  Concerns regarding medicines are outlined above.  No orders of the defined types were placed in this encounter.  No orders of the defined types were placed in this encounter.    Signed, Steffanie Dunn, MD, [facility], [facility] 08/14/2020 8:07 AM    Electrophysiology [facility]

## 2020-08-15 MED ORDER — FLECAINIDE ACETATE 50 MG PO TABS: 75.0000 mg | ORAL_TABLET | Freq: Two times a day (BID) | ORAL | 11 refills | Status: DC

## 2020-08-15 NOTE — Telephone Encounter (Signed)
Per Dr. Simeon Craft Pt start flecainide 75 mg PO BID and follow up with nurse visit for EKG  Outreach made to Pt's sister.  Advised of new medication.  Scheduled nurse visit EKG.

## 2020-08-30 ENCOUNTER — Ambulatory Visit (INDEPENDENT_AMBULATORY_CARE_PROVIDER_SITE_OTHER): Payer: Medicare HMO

## 2020-08-30 ENCOUNTER — Other Ambulatory Visit: Payer: Self-pay

## 2020-08-30 DIAGNOSIS — I471 Supraventricular tachycardia: Secondary | ICD-10-CM | POA: Insufficient documentation

## 2020-08-30 MED ORDER — METOPROLOL SUCCINATE ER 25 MG PO TB24: 25.0000 mg | ORAL_TABLET | Freq: Two times a day (BID) | ORAL | 3 refills | Status: DC

## 2020-08-30 NOTE — Progress Notes (Signed)
1.) Reason for visit: new start flecainide  2.) Name of MD requesting visit: Dr. Lalla Brothers  3.) H&P: Pt with SVT  4.) ROS related to problem: Pt unable to communicate.  Per his sister-she thinks he is doing better on the flecainide.    5.) Assessment and plan per MD: EKG reviewed by DOD.  EKG ok.  Per sister-she had stopped metoprolol succinate when Pt started flecainide.  Advised that flecainide and metoprolol work together.  She will restart the metoprolol.  BP today without metoprolol is 109/61.  Discussed with Dr. Lalla Brothers.  Will reduce Toprol to 25 mg PO BID.  New prescription sent.  Will determine timing of follow up with Dr. Lalla Brothers.

## 2020-09-12 DIAGNOSIS — E039 Hypothyroidism, unspecified: Secondary | ICD-10-CM | POA: Diagnosis not present

## 2020-09-12 DIAGNOSIS — B379 Candidiasis, unspecified: Secondary | ICD-10-CM | POA: Diagnosis not present

## 2020-09-12 DIAGNOSIS — Z23 Encounter for immunization: Secondary | ICD-10-CM | POA: Diagnosis not present

## 2020-09-12 DIAGNOSIS — I471 Supraventricular tachycardia: Secondary | ICD-10-CM | POA: Diagnosis not present

## 2020-09-12 DIAGNOSIS — Z6841 Body Mass Index (BMI) 40.0 and over, adult: Secondary | ICD-10-CM | POA: Diagnosis not present

## 2020-09-12 DIAGNOSIS — Z008 Encounter for other general examination: Secondary | ICD-10-CM | POA: Diagnosis not present

## 2020-11-06 ENCOUNTER — Ambulatory Visit: Payer: Medicare HMO | Admitting: Cardiology

## 2020-11-06 ENCOUNTER — Other Ambulatory Visit: Payer: Self-pay

## 2020-11-06 ENCOUNTER — Encounter: Payer: Self-pay | Admitting: Cardiology

## 2020-11-06 VITALS — BP 130/74 | HR 68 | Ht 69.0 in | Wt 292.2 lb

## 2020-11-06 DIAGNOSIS — I471 Supraventricular tachycardia: Secondary | ICD-10-CM | POA: Diagnosis not present

## 2020-11-06 DIAGNOSIS — Z79899 Other long term (current) drug therapy: Secondary | ICD-10-CM | POA: Diagnosis not present

## 2020-11-06 DIAGNOSIS — F840 Autistic disorder: Secondary | ICD-10-CM

## 2020-11-06 DIAGNOSIS — B372 Candidiasis of skin and nail: Secondary | ICD-10-CM | POA: Diagnosis not present

## 2020-11-06 MED ORDER — FLECAINIDE ACETATE 50 MG PO TABS: 75.0000 mg | ORAL_TABLET | Freq: Two times a day (BID) | ORAL | 3 refills | Status: DC

## 2020-11-06 MED ORDER — METOPROLOL SUCCINATE ER 25 MG PO TB24: 25.0000 mg | ORAL_TABLET | Freq: Two times a day (BID) | ORAL | 3 refills | Status: DC

## 2020-11-06 NOTE — Patient Instructions (Addendum)
Medication Instructions:  Your physician recommends that you continue on your current medications as directed. Please refer to the Current Medication list given to you today. *If you need a refill on your cardiac medications before your next appointment, please call your pharmacy*  Lab Work: None ordered. If you have labs (blood work) drawn today and your tests are completely normal, you will receive your results only by: MyChart Message (if you have MyChart) OR A paper copy in the mail If you have any lab test that is abnormal or we need to change your treatment, we will call you to review the results.  Testing/Procedures: None ordered.  Follow-Up: At [facility], you and your health needs are our priority.  As part of our continuing mission to provide you with exceptional heart care, we have created designated Provider Care Teams.  These Care Teams include your primary Cardiologist (physician) and Advanced Practice Providers (APPs -  Physician Assistants and Nurse Practitioners) who all work together to provide you with the care you need, when you need it.  Your next appointment:   Your physician wants you to follow-up in: March 27, 2021 at 11:30 am

## 2020-11-06 NOTE — Progress Notes (Signed)
Electrophysiology Office Follow up Visit Note:    Date:  11/06/2020   ID:  Daniel Summers, DOB 1965/04/24, MRN [id number]  PCP:  Daniel Board, NP  [facility] Cardiologist:  None  [facility] Electrophysiologist:  Daniel Epley, MD    Interval History:    Daniel Summers is a 55 y.o. male who presents for a follow up visit.  I saw the patient when he presented for an EP study that ultimately was canceled because of severe Candida intertrigo.  After the cancellation of the EP study, I started the patient on flecainide 75 mg by mouth twice daily in addition to his metoprolol succinate 25 mg by mouth twice daily.  Since starting the flecainide, the patient has not had a recurrence of his arrhythmia.  He is with his sister today in clinic who I have previously met.  She tells me that his primary care physician treated the Candida successfully but almost immediately after stopping the medications, the Candida returned.  Hovanes has lost over 20 pounds since I last saw him.  They are hoping that with weight loss the Candida will improve.     Past Medical History:  Diagnosis Date   Asthma    Autistic disorder of childhood onset    Bilateral cataracts 05/02/2011   Deaf    History of blood transfusion    At Birth   Mental retardation 05/02/2011   Osteoarthritis    right ankle   Tachycardia     Past Surgical History:  Procedure Laterality Date   EYE SURGERY     HERNIA REPAIR     INGUINAL HERNIA REPAIR      Current Medications: Current Meds  Medication Sig   clotrimazole (LOTRIMIN) 1 % cream Apply to affected area 2 times daily for up to 4 weeks   fluconazole (DIFLUCAN) 150 MG tablet Take 150 mg by mouth once.   ibuprofen (ADVIL) 200 MG tablet Take 200-400 mg by mouth daily as needed (pain).   [DISCONTINUED] flecainide (TAMBOCOR) 50 MG tablet Take 1.5 tablets (75 mg total) by mouth 2 (two) times daily.   [DISCONTINUED] metoprolol succinate (TOPROL XL) 25 MG 24 hr tablet  Take 1 tablet (25 mg total) by mouth in the morning and at bedtime.     Allergies:   Patient has no known allergies.   Social History   Socioeconomic History   Marital status: Single    Spouse name: Not on file   Number of children: Not on file   Years of education: Not on file   Highest education level: Not on file  Occupational History   Not on file  Tobacco Use   Smoking status: Never   Smokeless tobacco: Never  Vaping Use   Vaping Use: Not on file  Substance and Sexual Activity   Alcohol use: No   Drug use: No   Sexual activity: Not on file  Other Topics Concern   Not on file  Social History Narrative   Not on file   Social Determinants of Health   Financial Resource Strain: Not on file  Food Insecurity: Not on file  Transportation Needs: Not on file  Physical Activity: Not on file  Stress: Not on file  Social Connections: Not on file     Family History: The patient's family history includes Alcohol abuse in an other family member; Coronary artery disease in his father; Diabetes in his father and another family member; Heart disease in an other family member; Hypertension  in an other family member.  ROS:   Please see the history of present illness.    All other systems reviewed and are negative.  EKGs/Labs/Other Studies Reviewed:    The following studies were reviewed today:     EKG:  The ekg ordered today demonstrates sinus rhythm, incomplete right bundle branch block, PR interval 166 ms, QRS duration 112 ms.  Recent Labs: 03/15/2020: TSH 5.202 05/01/2020: Magnesium 1.9 05/15/2020: ALT 22 08/14/2020: BUN 24; Creatinine, Ser 1.16; Hemoglobin 16.2; Platelets 302; Potassium 3.9; Sodium 136  Recent Lipid Panel    Component Value Date/Time   CHOL 245 (H) 05/05/2012 1358   TRIG 151.0 (H) 05/05/2012 1358   HDL 28.70 (L) 05/05/2012 1358   CHOLHDL 9 05/05/2012 1358   VLDL 30.2 05/05/2012 1358   LDLCALC 124 (H) 05/02/2011 1338   LDLDIRECT 187.2 05/05/2012  1358    Physical Exam:    VS:  BP 130/74   Pulse 68   Ht $R'5\' 9"'RZ$  (1.753 m)   Wt 292 lb 3.2 oz (132.5 kg)   SpO2 90%   BMI 43.15 kg/m     Wt Readings from Last 3 Encounters:  11/06/20 292 lb 3.2 oz (132.5 kg)  08/13/20 (!) 321 lb 14 oz (146 kg)  07/25/20 (!) 323 lb 6.6 oz (146.7 kg)     GEN:  Well nourished, well developed in no acute distress.  Morbidly obese HEENT: Normal NECK: No JVD; No carotid bruits LYMPHATICS: No lymphadenopathy CARDIAC: RRR, no murmurs, rubs, gallops RESPIRATORY:  Clear to auscultation without rales, wheezing or rhonchi  ABDOMEN: Soft, non-tender, non-distended MUSCULOSKELETAL:  No edema; No deformity  SKIN: Warm and dry NEUROLOGIC:  Alert and oriented x 3 PSYCHIATRIC:  Normal affect   ASSESSMENT:    1. SVT (supraventricular tachycardia) ([place])   2. Autism   3. Candidal intertrigo   4. Encounter for long-term (current) use of high-risk medication    PLAN:    In order of problems listed above:   1. SVT (supraventricular tachycardia) (HCC) No recurrent episodes of SVT on flecainide 75 twice daily in Toprol-XL 25 twice daily.  Continue this regimen.  EKG today acceptable.  Ultimately, Aniel needs an EP study with ablation of his SVT.  We will need to time this for sometime during the winter months when his Candida infections will be less severe.  I will plan to see him back in December.  If he is still doing well, we will get the ablation scheduled and start the Candida treatment 3 to 4 weeks prior to the ablation date.  2. Autism His sister is with him today and she provides the majority of the history.  3. Candidal intertrigo We will plan to start treatment for Candida 3 to 4 weeks before planned ablation date during the winter months.  4. Encounter for long-term (current) use of high-risk medication EKG acceptable for continued flecainide use.  Follow-up in December.        Medication Adjustments/Labs and Tests Ordered: Current  medicines are reviewed at length with the patient today.  Concerns regarding medicines are outlined above.  Orders Placed This Encounter  Procedures   EKG 12-Lead   Meds ordered this encounter  Medications   flecainide (TAMBOCOR) 50 MG tablet    Sig: Take 1.5 tablets (75 mg total) by mouth 2 (two) times daily.    Dispense:  270 tablet    Refill:  3   metoprolol succinate (TOPROL XL) 25 MG 24 hr tablet  Sig: Take 1 tablet (25 mg total) by mouth in the morning and at bedtime.    Dispense:  180 tablet    Refill:  3    Family will call when needs a refill     Signed, Lars Mage, MD, [facility], [facility] 11/06/2020 1:16 PM    Electrophysiology [facility]

## 2021-01-22 ENCOUNTER — Emergency Department (HOSPITAL_BASED_OUTPATIENT_CLINIC_OR_DEPARTMENT_OTHER)
Admission: EM | Admit: 2021-01-22 | Discharge: 2021-01-23 | Disposition: A | Discharge status: Home / Self Care | Payer: Medicare HMO | Attending: Emergency Medicine | Admitting: Emergency Medicine

## 2021-01-22 ENCOUNTER — Emergency Department (HOSPITAL_BASED_OUTPATIENT_CLINIC_OR_DEPARTMENT_OTHER): Payer: Medicare HMO

## 2021-01-22 ENCOUNTER — Encounter (HOSPITAL_BASED_OUTPATIENT_CLINIC_OR_DEPARTMENT_OTHER): Payer: Self-pay

## 2021-01-22 ENCOUNTER — Other Ambulatory Visit: Payer: Self-pay

## 2021-01-22 DIAGNOSIS — F840 Autistic disorder: Secondary | ICD-10-CM | POA: Diagnosis not present

## 2021-01-22 DIAGNOSIS — J45909 Unspecified asthma, uncomplicated: Secondary | ICD-10-CM | POA: Diagnosis not present

## 2021-01-22 DIAGNOSIS — R079 Chest pain, unspecified: Secondary | ICD-10-CM | POA: Diagnosis not present

## 2021-01-22 DIAGNOSIS — K449 Diaphragmatic hernia without obstruction or gangrene: Secondary | ICD-10-CM | POA: Diagnosis not present

## 2021-01-22 DIAGNOSIS — R072 Precordial pain: Secondary | ICD-10-CM | POA: Insufficient documentation

## 2021-01-22 DIAGNOSIS — R0789 Other chest pain: Secondary | ICD-10-CM | POA: Diagnosis not present

## 2021-01-22 LAB — CBC
HCT: 46 % (ref 39.0–52.0)
Hemoglobin: 15.7 g/dL (ref 13.0–17.0)
MCH: 31.8 pg (ref 26.0–34.0)
MCHC: 34.1 g/dL (ref 30.0–36.0)
MCV: 93.1 fL (ref 80.0–100.0)
Platelets: 314 10*3/uL (ref 150–400)
RBC: 4.94 MIL/uL (ref 4.22–5.81)
RDW: 12.7 % (ref 11.5–15.5)
WBC: 7.7 10*3/uL (ref 4.0–10.5)
nRBC: 0 % (ref 0.0–0.2)

## 2021-01-22 LAB — BASIC METABOLIC PANEL
Anion gap: 8 (ref 5–15)
BUN: 24 mg/dL — ABNORMAL HIGH (ref 6–20)
CO2: 25 mmol/L (ref 22–32)
Calcium: 8.7 mg/dL — ABNORMAL LOW (ref 8.9–10.3)
Chloride: 101 mmol/L (ref 98–111)
Creatinine, Ser: 1.29 mg/dL — ABNORMAL HIGH (ref 0.61–1.24)
GFR, Estimated: >60 mL/min (ref >60)
Glucose, Bld: 84 mg/dL (ref 70–99)
Potassium: 4 mmol/L (ref 3.5–5.1)
Sodium: 134 mmol/L — ABNORMAL LOW (ref 135–145)

## 2021-01-22 LAB — TROPONIN I (HIGH SENSITIVITY)
Troponin I (High Sensitivity): 3 ng/L (ref <18)
Troponin I (High Sensitivity): 3 ng/L (ref <18)

## 2021-01-22 MED ORDER — ALUM & MAG HYDROXIDE-SIMETH 200-200-20 MG/5ML PO SUSP
30.0000 mL | Freq: Once | ORAL | Status: AC
  Administered 2021-01-23: 30 mL via ORAL
  Filled 2021-01-22: qty 30

## 2021-01-22 NOTE — ED Triage Notes (Signed)
Per sister/pi is autistic and deaf-pt c/o "heavy heart-10/10 pain" x 20 min-sister denies pt had fever/flu sx-pt to triage in w/c-NAD

## 2021-01-23 NOTE — ED Provider Notes (Signed)
[facility] EMERGENCY DEPARTMENT Provider Note  CSN: [id number] Arrival date & time: 01/22/21 1909  Chief Complaint(s) Chest Pain  HPI Daniel Summers is a 55 y.o. male with a past medical history listed below including autism and deafness here accompanied by sister who provides assistance and details of history.  Patient presents for left-sided chest pain that began around 6 PM this afternoon.  Described as soreness.  Patient has had prior episodes in the past related to SVT.  no alleviating or aggravating factors.  No reported radiation.  No associated shortness of breath.  No nausea or vomiting.  No recent fevers or chills.  No coughing or congestion.  Remainder of history, ROS, and physical exam limited due to patient's condition (autism). Additional information was obtained from sister.   Level V Caveat.    Chest Pain  Past Medical History Past Medical History:  Diagnosis Date   Asthma    Autistic disorder of childhood onset    Bilateral cataracts 05/02/2011   Deaf    History of blood transfusion    At Birth   Mental retardation 05/02/2011   Osteoarthritis    right ankle   Tachycardia    Patient Active Problem List   Diagnosis Date Noted   SVT (supraventricular tachycardia) (HCC) 08/30/2020   Achilles tendinosis 01/17/2013   OSA (obstructive sleep apnea) 12/08/2012   Acute bronchitis 07/28/2012   Unspecified asthma, with exacerbation 07/28/2012   Pre-ulcerative corn or callous 05/05/2012   Right shoulder pain 12/02/2011   Low back pain 12/02/2011   Bilateral ankle pain 12/02/2011   Mental retardation 05/02/2011   Bilateral cataracts 05/02/2011   Asthma    Osteoarthritis    Preventative health care 04/27/2011   Deaf    Home Medication(s) Prior to Admission medications   Medication Sig Start Date End Date Taking? Authorizing Provider  clotrimazole (LOTRIMIN) 1 % cream Apply to affected area 2 times daily for up to 4 weeks 07/25/20   Pricilla Loveless, MD   flecainide (TAMBOCOR) 50 MG tablet Take 1.5 tablets (75 mg total) by mouth 2 (two) times daily. 11/06/20   Lanier Prude, MD  fluconazole (DIFLUCAN) 150 MG tablet Take 150 mg by mouth once. 08/02/20   [provider]  ibuprofen (ADVIL) 200 MG tablet Take 200-400 mg by mouth daily as needed (pain).    [provider]  metoprolol succinate (TOPROL XL) 25 MG 24 hr tablet Take 1 tablet (25 mg total) by mouth in the morning and at bedtime. 11/06/20   Lanier Prude, MD  potassium chloride SA (KLOR-CON) 20 MEQ tablet Take 1 tablet (20 mEq total) by mouth daily. Patient not taking: Reported on 11/06/2020 07/08/20   Benjiman Core, MD                                                                                                                                    Past Surgical  History Past Surgical History:  Procedure Laterality Date   EYE SURGERY     HERNIA REPAIR     INGUINAL HERNIA REPAIR     Family History Family History  Problem Relation Age of Onset   Diabetes Father    Coronary artery disease Father    Heart disease Other    Hypertension Other    Diabetes Other    Alcohol abuse Other     Social History Social History   Tobacco Use   Smoking status: Never   Smokeless tobacco: Never  Substance Use Topics   Alcohol use: No   Drug use: No   Allergies Patient has no known allergies.  Review of Systems Review of Systems  Cardiovascular:  Positive for chest pain.  Unable to obtain due to autism Physical Exam Vital Signs  I have reviewed the triage vital signs BP 128/66 (BP Location: Right Arm)   Pulse (!) 56   Temp 98.1 F (36.7 C) (Oral)   Resp 13   Ht 5\' 9"  (1.753 m)   Wt (!) 136.5 kg   SpO2 98%   BMI 44.45 kg/m   Physical Exam Vitals reviewed.  Constitutional:      General: Daniel Summers is not in acute distress.    Appearance: Daniel Summers is well-developed. Daniel Summers is not diaphoretic.  HENT:     Head: Normocephalic and atraumatic.     Nose: Nose normal.   Eyes:     General: No scleral icterus.       Right eye: No discharge.        Left eye: No discharge.     Conjunctiva/sclera: Conjunctivae normal.     Pupils: Pupils are equal, round, and reactive to light.  Cardiovascular:     Rate and Rhythm: Normal rate and regular rhythm.     Heart sounds: No murmur heard.   No friction rub. No gallop.  Pulmonary:     Effort: Pulmonary effort is normal. No respiratory distress.     Breath sounds: Normal breath sounds. No stridor. No rales.  Chest:     Chest wall: No tenderness.  Abdominal:     General: There is no distension.     Palpations: Abdomen is soft.     Tenderness: There is no abdominal tenderness.  Musculoskeletal:        General: No tenderness.     Cervical back: Normal range of motion and neck supple.  Skin:    General: Skin is warm and dry.     Findings: No erythema or rash.     Comments: Patient has several areas of fungal skin infection in the creases of the breast and pannus (chronic and currently being treated)  Neurological:     Mental Status: Daniel Summers is alert.     Comments: Pleasant Follow directions with hand gestures.    ED Results and Treatments Labs (all labs ordered are listed, but only abnormal results are displayed) Labs Reviewed  BASIC METABOLIC PANEL - Abnormal; Notable for the following components:      Result Value   Sodium 134 (*)    BUN 24 (*)    Creatinine, Ser 1.29 (*)    Calcium 8.7 (*)    All other components within normal limits  CBC  TROPONIN I (HIGH SENSITIVITY)  TROPONIN I (HIGH SENSITIVITY)  EKG  EKG Interpretation  Date/Time:  Tuesday January 22 2021 19:18:59 EDT Ventricular Rate:  56 PR Interval:  162 QRS Duration: 112 QT Interval:  440 QTC Calculation: 424 R Axis:   80 Text Interpretation: Sinus bradycardia Incomplete right bundle branch block Borderline ECG No acute  changes Confirmed by Drema Pry [phone]) on 01/22/2021 11:53:07 PM       Radiology DG Chest 2 View  Result Date: 01/22/2021 CLINICAL DATA:  Chest pain. EXAM: CHEST - 2 VIEW COMPARISON:  Aug 14, 2020 FINDINGS: There is no evidence of acute infiltrate, pleural effusion or pneumothorax. The heart size and mediastinal contours are within normal limits. A small hiatal hernia is seen. The visualized skeletal structures are unremarkable. IMPRESSION: No active cardiopulmonary disease. Electronically Signed   By: Aram Candela M.D.   On: 01/22/2021 19:47    Pertinent labs & imaging results that were available during my care of the patient were reviewed by me and considered in my medical decision making (see MDM for details).  Medications Ordered in ED Medications  alum & mag hydroxide-simeth (MAALOX/MYLANTA) 200-200-20 MG/5ML suspension 30 mL (30 mLs Oral Given 01/23/21 0028)                                                                                                                                     Procedures Procedures  (including critical care time)  Medical Decision Making / ED Course I have reviewed the nursing notes for this encounter and the patient's prior records (if available in EHR or on provided paperwork).  ASKIA HAZELIP was evaluated in Emergency Department on 01/23/2021 for the symptoms described in the history of present illness. Daniel Summers was evaluated in the context of the global COVID-19 pandemic, which necessitated consideration that the patient might be at risk for infection with the SARS-CoV-2 virus that causes COVID-19. Institutional protocols and algorithms that pertain to the evaluation of patients at risk for COVID-19 are in a state of rapid change based on information released by regulatory bodies including the CDC and federal and state organizations. These policies and algorithms were followed during the patient's care in the ED.     Difficult to determine  characteristics of pain due to communication limitations. EKG without acute ischemic changes, dysrhythmias, evidence of pericarditis. Troponin negative x2.  Doubt ACS.  Low suspicion for pulmonary embolism.  Low suspicion for dissection or esophageal perforation. Chest x-ray without evidence suggestive of pneumonia, pneumothorax, pneumomediastinum.  No abnormal contour of the mediastinum to suggest dissection. No evidence of acute injuries.   Pertinent labs & imaging results that were available during my care of the patient were reviewed by me and considered in my medical decision making:  No leukocytosis or anemia. No significant electrolyte derangements. Mild renal insufficiency without evidence of AKI.  Pain completely resolved after GI cocktail.  Favoring GI etiology.  Patient and family comfortable being discharged home.  Already has  cardiology established.  Final Clinical Impression(s) / ED Diagnoses Final diagnoses:  Precordial pain   The patient appears reasonably screened and/or stabilized for discharge and I doubt any other medical condition or other [facility] requiring further screening, evaluation, or treatment in the ED at this time prior to discharge. Safe for discharge with strict return precautions.  Disposition: Discharge  Condition: Good  I have discussed the results, Dx and Tx plan with the patient/family who expressed understanding and agree(s) with the plan. Discharge instructions discussed at length. The patient/family was given strict return precautions who verbalized understanding of the instructions. No further questions at time of discharge.    ED Discharge Orders     None       Follow Up: Stann Ore, NP [address] [place] [address] [phone]  Call  to schedule an appointment for close follow up     This chart was dictated using voice recognition software.  Despite best efforts to proofread,  errors can occur which can change  the documentation meaning.    Nira Conn, MD 01/23/21 [phone]

## 2021-03-26 NOTE — Progress Notes (Deleted)
Electrophysiology Office Follow up Visit Note:    Date:  03/26/2021   ID:  Daniel Summers, DOB [birth year], MRN [id number]  PCP:  Stann Ore, NP  [facility] Cardiologist:  None  [facility] Electrophysiologist:  Lanier Prude, MD    Interval History:    Daniel Summers is a 55 y.o. male who presents for a follow up visit. He has a history of SVT on flecainide and metoprolol. In the past, we tried to schedule an EP study for definitive management of his SVT but bilateral groin wounds/rashes prevented safe ablation.        Past Medical History:  Diagnosis Date   Asthma    Autistic disorder of childhood onset    Bilateral cataracts 05/02/2011   Deaf    History of blood transfusion    At Birth   Mental retardation 05/02/2011   Osteoarthritis    right ankle   Tachycardia     Past Surgical History:  Procedure Laterality Date   EYE SURGERY     HERNIA REPAIR     INGUINAL HERNIA REPAIR      Current Medications: No outpatient medications have been marked as taking for the 03/27/21 encounter (Appointment) with Lanier Prude, MD.     Allergies:   Patient has no known allergies.   Social History   Socioeconomic History   Marital status: Single    Spouse name: Not on file   Number of children: Not on file   Years of education: Not on file   Highest education level: Not on file  Occupational History   Not on file  Tobacco Use   Smoking status: Never   Smokeless tobacco: Never  Vaping Use   Vaping Use: Not on file  Substance and Sexual Activity   Alcohol use: No   Drug use: No   Sexual activity: Not on file  Other Topics Concern   Not on file  Social History Narrative   Not on file   Social Determinants of Health   Financial Resource Strain: Not on file  Food Insecurity: Not on file  Transportation Needs: Not on file  Physical Activity: Not on file  Stress: Not on file  Social Connections: Not on file     Family History: The patient's  family history includes Alcohol abuse in an other family member; Coronary artery disease in his father; Diabetes in his father and another family member; Heart disease in an other family member; Hypertension in an other family member.  ROS:   Please see the history of present illness.    All other systems reviewed and are negative.  EKGs/Labs/Other Studies Reviewed:    The following studies were reviewed today:   EKG:  The ekg ordered today demonstrates ***  Recent Labs: 05/01/2020: Magnesium 1.9 05/15/2020: ALT 22 01/22/2021: BUN 24; Creatinine, Ser 1.29; Hemoglobin 15.7; Platelets 314; Potassium 4.0; Sodium 134  Recent Lipid Panel    Component Value Date/Time   CHOL 245 (H) 05/05/2012 1358   TRIG 151.0 (H) 05/05/2012 1358   HDL 28.70 (L) 05/05/2012 1358   CHOLHDL 9 05/05/2012 1358   VLDL 30.2 05/05/2012 1358   LDLCALC 124 (H) 05/02/2011 1338   LDLDIRECT 187.2 05/05/2012 1358    Physical Exam:    VS:  There were no vitals taken for this visit.    Wt Readings from Last 3 Encounters:  01/22/21 (!) 301 lb (136.5 kg)  11/06/20 292 lb 3.2 oz (132.5 kg)  08/13/20 (!) 321  lb 14 oz (146 kg)     GEN: *** Well nourished, well developed in no acute distress HEENT: Normal NECK: No JVD; No carotid bruits LYMPHATICS: No lymphadenopathy CARDIAC: ***RRR, no murmurs, rubs, gallops RESPIRATORY:  Clear to auscultation without rales, wheezing or rhonchi  ABDOMEN: Soft, non-tender, non-distended MUSCULOSKELETAL:  No edema; No deformity  SKIN: Warm and dry NEUROLOGIC:  Alert and oriented x 3 PSYCHIATRIC:  Normal affect        ASSESSMENT:    No diagnosis found. PLAN:    In order of problems listed above:   ? Ablation attempt vs continue flecainide        Total time spent with patient today *** minutes. This includes reviewing records, evaluating the patient and coordinating care.   Medication Adjustments/Labs and Tests Ordered: Current medicines are reviewed at length  with the patient today.  Concerns regarding medicines are outlined above.  No orders of the defined types were placed in this encounter.  No orders of the defined types were placed in this encounter.    Signed, Steffanie Dunn, MD, [facility], [facility] 03/26/2021 9:33 PM    Electrophysiology [facility]

## 2021-03-27 ENCOUNTER — Ambulatory Visit: Payer: Medicare HMO | Admitting: Cardiology

## 2021-03-27 DIAGNOSIS — E038 Other specified hypothyroidism: Secondary | ICD-10-CM | POA: Diagnosis not present

## 2021-11-08 ENCOUNTER — Other Ambulatory Visit: Payer: Self-pay | Admitting: Cardiology

## 2022-01-27 NOTE — Progress Notes (Unsigned)
Electrophysiology Office Follow up Visit Note:    Date:  01/27/2022   ID:  Daniel Summers, DOB 1965/05/10, MRN [id number]  PCP:  Marzetta Board, NP  [facility] Cardiologist:  None  [facility] Electrophysiologist:  Vickie Epley, MD    Interval History:    Daniel Summers is a 56 y.o. male who presents for a follow up visit. They were last seen in clinic 11/06/2020 for SVT. He has been maintained on flecainide for SVT. He was originally scheduled for EP study and ablation but this was cancelled due to skin issues in the groin bilaterally.     Past Medical History:  Diagnosis Date   Asthma    Autistic disorder of childhood onset    Bilateral cataracts 05/02/2011   Deaf    History of blood transfusion    At Birth   Mental retardation 05/02/2011   Osteoarthritis    right ankle   Tachycardia     Past Surgical History:  Procedure Laterality Date   EYE SURGERY     HERNIA REPAIR     INGUINAL HERNIA REPAIR      Current Medications: No outpatient medications have been marked as taking for the 01/28/22 encounter (Appointment) with Vickie Epley, MD.     Allergies:   Patient has no known allergies.   Social History   Socioeconomic History   Marital status: Single    Spouse name: Not on file   Number of children: Not on file   Years of education: Not on file   Highest education level: Not on file  Occupational History   Not on file  Tobacco Use   Smoking status: Never   Smokeless tobacco: Never  Vaping Use   Vaping Use: Not on file  Substance and Sexual Activity   Alcohol use: No   Drug use: No   Sexual activity: Not on file  Other Topics Concern   Not on file  Social History Narrative   Not on file   Social Determinants of Health   Financial Resource Strain: Not on file  Food Insecurity: Not on file  Transportation Needs: Not on file  Physical Activity: Not on file  Stress: Not on file  Social Connections: Not on file     Family  History: The patient's family history includes Alcohol abuse in an other family member; Coronary artery disease in his father; Diabetes in his father and another family member; Heart disease in an other family member; Hypertension in an other family member.  ROS:   Please see the history of present illness.    All other systems reviewed and are negative.  EKGs/Labs/Other Studies Reviewed:    The following studies were reviewed today:   Recent Labs: No results found for requested labs within last 365 days.  Recent Lipid Panel    Component Value Date/Time   CHOL 245 (H) 05/05/2012 1358   TRIG 151.0 (H) 05/05/2012 1358   HDL 28.70 (L) 05/05/2012 1358   CHOLHDL 9 05/05/2012 1358   VLDL 30.2 05/05/2012 1358   LDLCALC 124 (H) 05/02/2011 1338   LDLDIRECT 187.2 05/05/2012 1358    Physical Exam:    VS:  There were no vitals taken for this visit.    Wt Readings from Last 3 Encounters:  01/22/21 (!) 301 lb (136.5 kg)  11/06/20 292 lb 3.2 oz (132.5 kg)  08/13/20 (!) 321 lb 14 oz (146 kg)     GEN: *** Well nourished, well developed in no  acute distress HEENT: Normal NECK: No JVD; No carotid bruits LYMPHATICS: No lymphadenopathy CARDIAC: ***RRR, no murmurs, rubs, gallops RESPIRATORY:  Clear to auscultation without rales, wheezing or rhonchi  ABDOMEN: Soft, non-tender, non-distended MUSCULOSKELETAL:  No edema; No deformity  SKIN: Warm and dry NEUROLOGIC:  Alert and oriented x 3 PSYCHIATRIC:  Normal affect        ASSESSMENT:    No diagnosis found. PLAN:    In order of problems listed above:  #SVT  #Flecainide monitoring  F/u 6 months with APP.         Total time spent with patient today *** minutes. This includes reviewing records, evaluating the patient and coordinating care.   Medication Adjustments/Labs and Tests Ordered: Current medicines are reviewed at length with the patient today.  Concerns regarding medicines are outlined above.  No orders of the  defined types were placed in this encounter.  No orders of the defined types were placed in this encounter.    Signed, Lars Mage, MD, [facility], [facility] 01/27/2022 9:33 PM    Electrophysiology [facility]

## 2022-01-28 ENCOUNTER — Ambulatory Visit: Payer: Medicare HMO | Attending: Cardiology | Admitting: Cardiology

## 2022-01-28 ENCOUNTER — Encounter: Payer: Self-pay | Admitting: Cardiology

## 2022-01-28 VITALS — BP 106/58 | HR 54 | Ht 69.0 in | Wt 313.0 lb

## 2022-01-28 DIAGNOSIS — I471 Supraventricular tachycardia, unspecified: Secondary | ICD-10-CM

## 2022-01-28 DIAGNOSIS — Z79899 Other long term (current) drug therapy: Secondary | ICD-10-CM | POA: Diagnosis not present

## 2022-01-28 MED ORDER — METOPROLOL SUCCINATE ER 25 MG PO TB24: 25.0000 mg | ORAL_TABLET | Freq: Two times a day (BID) | ORAL | 3 refills | Status: DC

## 2022-01-28 MED ORDER — FLECAINIDE ACETATE 50 MG PO TABS: ORAL_TABLET | ORAL | 3 refills | Status: DC

## 2022-01-28 NOTE — Patient Instructions (Signed)
Medication Instructions:  none *If you need a refill on your cardiac medications before your next appointment, please call your pharmacy*   Lab Work: none If you have labs (blood work) drawn today and your tests are completely normal, you will receive your results only by: MyChart Message (if you have MyChart) OR A paper copy in the mail If you have any lab test that is abnormal or we need to change your treatment, we will call you to review the results.   Testing/Procedures: none   Follow-Up: At [facility], you and your health needs are our priority.  As part of our continuing mission to provide you with exceptional heart care, we have created designated Provider Care Teams.  These Care Teams include your primary Cardiologist (physician) and Advanced Practice Providers (APPs -  Physician Assistants and Nurse Practitioners) who all work together to provide you with the care you need, when you need it.  We recommend signing up for the patient portal called "MyChart".  Sign up information is provided on this After Visit Summary.  MyChart is used to connect with patients for Virtual Visits (Telemedicine).  Patients are able to view lab/test results, encounter notes, upcoming appointments, etc.  Non-urgent messages can be sent to your provider as well.   To learn more about what you can do with MyChart, go to [email or web address]://[email or web address].com.    Your next appointment:   6 month(s)  The format for your next appointment:   In Person  Provider:   You will see one of the following Advanced Practice Providers on your designated Care Team:   Renee Ursuy, PA-C Michael "Andy" Tillery, PA-C      Other Instructions None   Important Information About Sugar       

## 2022-01-28 NOTE — Progress Notes (Signed)
Electrophysiology Office Follow up Visit Note:    Date:  01/28/2022   ID:  Daniel Summers, DOB 02-21-1966, MRN [id number]  PCP:  Marzetta Board, NP  [facility] Cardiologist:  None  [facility] Electrophysiologist:  Vickie Epley, MD    Interval History:    Daniel Summers is a 56 y.o. male who presents for a follow up visit. They were last seen in clinic 11/06/2020 for SVT. He has been maintained on flecainide for SVT. He was originally scheduled for EP study and ablation but this was cancelled due to skin issues in the groin bilaterally.  Today, he is accompanied by his sister. She has concerns about his blood pressure being too low. She denies that he has lost any consciousness or dizziness.   His family continues to monitor his yeast infection in his groin area.   He denies any palpitations, chest pain, shortness of breath, or peripheral edema. No lightheadedness, headaches, syncope, orthopnea, or PND.     Past Medical History:  Diagnosis Date   Asthma    Autistic disorder of childhood onset    Bilateral cataracts 05/02/2011   Deaf    History of blood transfusion    At Birth   Mental retardation 05/02/2011   Osteoarthritis    right ankle   Tachycardia     Past Surgical History:  Procedure Laterality Date   EYE SURGERY     HERNIA REPAIR     INGUINAL HERNIA REPAIR      Current Medications: Current Meds  Medication Sig   clotrimazole (LOTRIMIN) 1 % cream Apply to affected area 2 times daily for up to 4 weeks   fluconazole (DIFLUCAN) 150 MG tablet Take 150 mg by mouth once.   ibuprofen (ADVIL) 200 MG tablet Take 200-400 mg by mouth daily as needed (pain).   [DISCONTINUED] flecainide (TAMBOCOR) 50 MG tablet TAKE 1 AND 1/2 TABLETS(75 MG) BY MOUTH TWICE DAILY   [DISCONTINUED] metoprolol succinate (TOPROL XL) 25 MG 24 hr tablet Take 1 tablet (25 mg total) by mouth in the morning and at bedtime.     Allergies:   Patient has no known allergies.   Social History    Socioeconomic History   Marital status: Single    Spouse name: Not on file   Number of children: Not on file   Years of education: Not on file   Highest education level: Not on file  Occupational History   Not on file  Tobacco Use   Smoking status: Never   Smokeless tobacco: Never  Vaping Use   Vaping Use: Not on file  Substance and Sexual Activity   Alcohol use: No   Drug use: No   Sexual activity: Not on file  Other Topics Concern   Not on file  Social History Narrative   Not on file   Social Determinants of Health   Financial Resource Strain: Not on file  Food Insecurity: Not on file  Transportation Needs: Not on file  Physical Activity: Not on file  Stress: Not on file  Social Connections: Not on file     Family History: The patient's family history includes Alcohol abuse in an other family member; Coronary artery disease in his father; Diabetes in his father and another family member; Heart disease in an other family member; Hypertension in an other family member.  ROS:   Please see the history of present illness.    All other systems reviewed and are negative.  EKGs/Labs/Other Studies Reviewed:  The following studies were reviewed today:  EKG: 01/28/2022: The ekg ordered today demonstrates sinus rhythm, PR interval 170 ms, QRS duration 106 ms, incomplete right bundle branch block (stable from prior)   Recent Labs: No results found for requested labs within last 365 days.  Recent Lipid Panel    Component Value Date/Time   CHOL 245 (H) 05/05/2012 1358   TRIG 151.0 (H) 05/05/2012 1358   HDL 28.70 (L) 05/05/2012 1358   CHOLHDL 9 05/05/2012 1358   VLDL 30.2 05/05/2012 1358   LDLCALC 124 (H) 05/02/2011 1338   LDLDIRECT 187.2 05/05/2012 1358    Physical Exam:    VS:  BP (!) 106/58   Pulse (!) 54   Ht 5\' 9"  (1.753 m)   Wt (!) 313 lb (142 kg)   SpO2 95%   BMI 46.22 kg/m     Wt Readings from Last 3 Encounters:  01/28/22 (!) 313 lb (142 kg)   01/22/21 (!) 301 lb (136.5 kg)  11/06/20 292 lb 3.2 oz (132.5 kg)     GEN: Well nourished, well developed in no acute distress.  Obese HEENT: Normal NECK: No JVD; No carotid bruits LYMPHATICS: No lymphadenopathy CARDIAC: RRR, no murmurs, rubs, gallops RESPIRATORY:  Clear to auscultation without rales, wheezing or rhonchi  ABDOMEN: Soft, non-tender, non-distended MUSCULOSKELETAL:  No edema; No deformity  SKIN: Warm and dry NEUROLOGIC:  Alert and oriented x 3 PSYCHIATRIC:  Normal affect        ASSESSMENT:    1. SVT (supraventricular tachycardia)   2. Encounter for long-term (current) use of high-risk medication    PLAN:    In order of problems listed above:  #SVT #Flecainide monitoring No recurrence on flecainide therapy.  EKG stable for ongoing flecainide use.  We will hold off on plans for ablation at this time given chronic inguinal fungal infection.  F/u 6 months with APP.    Medication Adjustments/Labs and Tests Ordered: Current medicines are reviewed at length with the patient today.  Concerns regarding medicines are outlined above.  Orders Placed This Encounter  Procedures   EKG 12-Lead   Meds ordered this encounter  Medications   flecainide (TAMBOCOR) 50 MG tablet    Sig: TAKE 1 AND 1/2 TABLETS(75 MG) BY MOUTH TWICE DAILY    Dispense:  270 tablet    Refill:  3   metoprolol succinate (TOPROL XL) 25 MG 24 hr tablet    Sig: Take 1 tablet (25 mg total) by mouth in the morning and at bedtime.    Dispense:  180 tablet    Refill:  3    Family will call when needs a refill    I,Rachel Rivera,acting as a scribe for Vickie Epley, MD.,have documented all relevant documentation on the behalf of Vickie Epley, MD,as directed by  Vickie Epley, MD while in the presence of Vickie Epley, MD.  I, Vickie Epley, MD, have reviewed all documentation for this visit. The documentation on 01/28/22 for the exam, diagnosis, procedures, and orders are all  accurate and complete.   Signed, Lars Mage, MD, [facility], [facility] 01/28/2022 1:38 PM    Electrophysiology [facility]

## 2022-10-06 IMAGING — DX DG CHEST 1V PORT
1 series · 1 of 1 positions shown · non-contrast
Comparison: July 18, 2013.

CLINICAL DATA: Chest pain.

EXAM:
PORTABLE CHEST 1 VIEW

[chest ap]
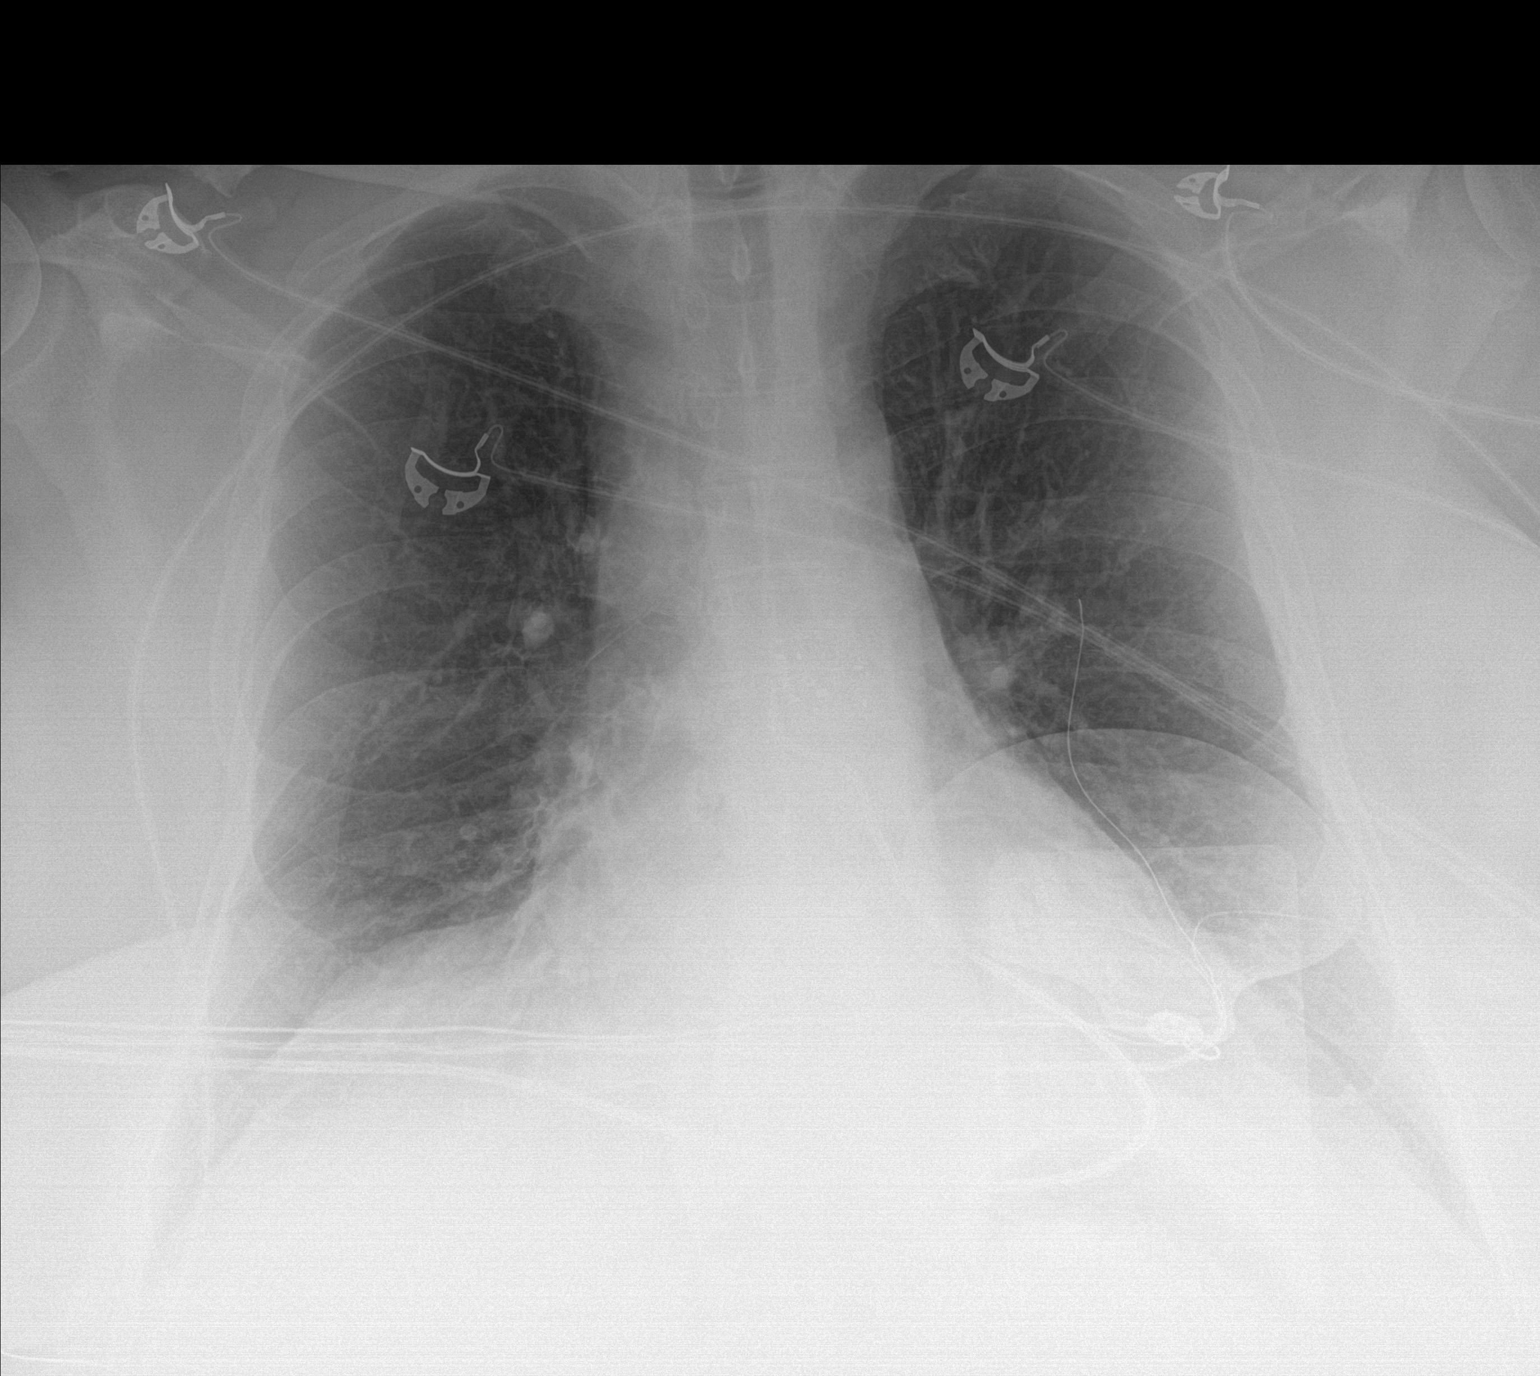

[1 of 1 positions shown; findings below may reference images not displayed]

FINDINGS: Stable cardiomediastinal silhouette. No pneumothorax or pleural
effusion is noted. Both lungs are clear. The visualized skeletal
structures are unremarkable.
IMPRESSION: No active disease.

## 2023-01-10 ENCOUNTER — Emergency Department (HOSPITAL_BASED_OUTPATIENT_CLINIC_OR_DEPARTMENT_OTHER): Payer: Medicare HMO

## 2023-01-10 ENCOUNTER — Encounter (HOSPITAL_BASED_OUTPATIENT_CLINIC_OR_DEPARTMENT_OTHER): Payer: Self-pay | Admitting: Emergency Medicine

## 2023-01-10 ENCOUNTER — Emergency Department (HOSPITAL_BASED_OUTPATIENT_CLINIC_OR_DEPARTMENT_OTHER)
Admission: EM | Admit: 2023-01-10 | Discharge: 2023-01-10 | Disposition: A | Discharge status: Home / Self Care | Payer: Medicare HMO | Attending: Emergency Medicine | Admitting: Emergency Medicine

## 2023-01-10 ENCOUNTER — Other Ambulatory Visit: Payer: Self-pay

## 2023-01-10 DIAGNOSIS — F840 Autistic disorder: Secondary | ICD-10-CM | POA: Diagnosis not present

## 2023-01-10 DIAGNOSIS — M7989 Other specified soft tissue disorders: Secondary | ICD-10-CM | POA: Diagnosis present

## 2023-01-10 DIAGNOSIS — L03115 Cellulitis of right lower limb: Secondary | ICD-10-CM | POA: Diagnosis not present

## 2023-01-10 DIAGNOSIS — L03119 Cellulitis of unspecified part of limb: Secondary | ICD-10-CM

## 2023-01-10 DIAGNOSIS — Z79899 Other long term (current) drug therapy: Secondary | ICD-10-CM | POA: Diagnosis not present

## 2023-01-10 DIAGNOSIS — R600 Localized edema: Secondary | ICD-10-CM | POA: Diagnosis not present

## 2023-01-10 DIAGNOSIS — L03116 Cellulitis of left lower limb: Secondary | ICD-10-CM | POA: Insufficient documentation

## 2023-01-10 LAB — BASIC METABOLIC PANEL
Anion gap: 12 (ref 5–15)
BUN: 20 mg/dL (ref 6–20)
CO2: 25 mmol/L (ref 22–32)
Calcium: 8.8 mg/dL — ABNORMAL LOW (ref 8.9–10.3)
Chloride: 100 mmol/L (ref 98–111)
Creatinine, Ser: 1.17 mg/dL (ref 0.61–1.24)
GFR, Estimated: >60 mL/min (ref >60)
Glucose, Bld: 77 mg/dL (ref 70–99)
Potassium: 3.7 mmol/L (ref 3.5–5.1)
Sodium: 137 mmol/L (ref 135–145)

## 2023-01-10 LAB — CBC WITH DIFFERENTIAL/PLATELET
Abs Immature Granulocytes: 0.06 10*3/uL (ref 0.00–0.07)
Basophils Absolute: 0.1 10*3/uL (ref 0.0–0.1)
Basophils Relative: 1 %
Eosinophils Absolute: 0.1 10*3/uL (ref 0.0–0.5)
Eosinophils Relative: 2 %
HCT: 47.6 % (ref 39.0–52.0)
Hemoglobin: 15.9 g/dL (ref 13.0–17.0)
Immature Granulocytes: 1 %
Lymphocytes Relative: 9 %
Lymphs Abs: 0.8 10*3/uL (ref 0.7–4.0)
MCH: 31.8 pg (ref 26.0–34.0)
MCHC: 33.4 g/dL (ref 30.0–36.0)
MCV: 95.2 fL (ref 80.0–100.0)
Monocytes Absolute: 0.9 10*3/uL (ref 0.1–1.0)
Monocytes Relative: 11 %
Neutro Abs: 6.5 10*3/uL (ref 1.7–7.7)
Neutrophils Relative %: 76 %
Platelets: 315 10*3/uL (ref 150–400)
RBC: 5 MIL/uL (ref 4.22–5.81)
RDW: 13.1 % (ref 11.5–15.5)
WBC: 8.4 10*3/uL (ref 4.0–10.5)
nRBC: 0 % (ref 0.0–0.2)

## 2023-01-10 LAB — BRAIN NATRIURETIC PEPTIDE: B Natriuretic Peptide: 39.4 pg/mL (ref 0.0–100.0)

## 2023-01-10 MED ORDER — DOXYCYCLINE HYCLATE 100 MG PO CAPS: 100.0000 mg | ORAL_CAPSULE | Freq: Two times a day (BID) | ORAL | 0 refills | Status: AC

## 2023-01-10 MED ORDER — BACITRACIN ZINC 500 UNIT/GM EX OINT
TOPICAL_OINTMENT | Freq: Once | CUTANEOUS | Status: AC
  Administered 2023-01-10: 31.5 via TOPICAL
  Filled 2023-01-10: qty 28.35

## 2023-01-10 MED ORDER — CEFAZOLIN SODIUM-DEXTROSE 2-4 GM/100ML-% IV SOLN
2.0000 g | Freq: Once | INTRAVENOUS | Status: AC
  Administered 2023-01-10: 2 g via INTRAVENOUS
  Filled 2023-01-10: qty 100

## 2023-01-10 MED ORDER — MUPIROCIN CALCIUM 2 % EX CREA: 1.0000 | TOPICAL_CREAM | Freq: Two times a day (BID) | CUTANEOUS | 0 refills | Status: AC

## 2023-01-10 NOTE — ED Provider Notes (Signed)
[place] [facility] Provider Note   CSN: [id number] Arrival date & time: 01/10/23  1156     History  Chief Complaint  Patient presents with   Leg Swelling    LEOR Daniel Summers is a 57 y.o. male.  Patient is a 58 year old male who presents with leg swelling.  He has a history of autism and deafness as well as SVT.  He is on flecainide and metoprolol but not on anticoagulants.  History is obtained through his sister who is at bedside who he lives with.  She states he has had some increased leg swelling over the last couple of days.  He is also had some redness and weeping sores on the leg.  They do not seem to really be bothering him other than the drainage he does not like.  He has not complained of any pain.  No fevers.  No increased shortness of breath.  She states he is walking up and down stairs without difficulty.  No complaints of chest pain.  No vomiting.  She does say that they had some family over recently and he was eating a lot more food than he normally does.  He has some baseline mild leg swelling but she says they have gotten a bit more swollen than they typically are.       Home Medications Prior to Admission medications   Medication Sig Start Date End Date Taking? Authorizing Provider  doxycycline (VIBRAMYCIN) 100 MG capsule Take 1 capsule (100 mg total) by mouth 2 (two) times daily. One po bid x 7 days 01/10/23  Yes Rolan Bucco, MD  mupirocin cream (BACTROBAN) 2 % Apply 1 Application topically 2 (two) times daily. 01/10/23  Yes Rolan Bucco, MD  clotrimazole (LOTRIMIN) 1 % cream Apply to affected area 2 times daily for up to 4 weeks 07/25/20   Pricilla Loveless, MD  flecainide (TAMBOCOR) 50 MG tablet TAKE 1 AND 1/2 TABLETS(75 MG) BY MOUTH TWICE DAILY 01/28/22   Lanier Prude, MD  fluconazole (DIFLUCAN) 150 MG tablet Take 150 mg by mouth once. 08/02/20   [provider]  ibuprofen (ADVIL) 200 MG tablet Take 200-400 mg by  mouth daily as needed (pain).    [provider]  metoprolol succinate (TOPROL XL) 25 MG 24 hr tablet Take 1 tablet (25 mg total) by mouth in the morning and at bedtime. 01/28/22   Lanier Prude, MD      Allergies    Patient has no known allergies.    Review of Systems   Review of Systems  Constitutional:  Negative for chills, diaphoresis, fatigue and fever.  HENT:  Negative for congestion, rhinorrhea and sneezing.   Eyes: Negative.   Respiratory:  Negative for cough, chest tightness and shortness of breath.   Cardiovascular:  Positive for leg swelling. Negative for chest pain.  Gastrointestinal:  Negative for abdominal pain, blood in stool, diarrhea, nausea and vomiting.  Genitourinary:  Negative for difficulty urinating, flank pain, frequency and hematuria.  Musculoskeletal:  Negative for arthralgias and back pain.  Skin:  Positive for wound. Negative for rash.  Neurological:  Negative for dizziness, speech difficulty, weakness, numbness and headaches.    Physical Exam Updated Vital Signs BP (!) 103/58   Pulse 80   Temp 98.4 F (36.9 C) (Oral)   Resp 17   Ht 5\' 9"  (1.753 m)   SpO2 98%   BMI 46.22 kg/m  Physical Exam Constitutional:  Appearance: He is well-developed.  HENT:     Head: Normocephalic and atraumatic.  Eyes:     Pupils: Pupils are equal, round, and reactive to light.  Cardiovascular:     Rate and Rhythm: Normal rate and regular rhythm.     Heart sounds: Normal heart sounds.  Pulmonary:     Effort: Pulmonary effort is normal. No respiratory distress.     Breath sounds: Normal breath sounds. No wheezing or rales.  Chest:     Chest wall: No tenderness.  Abdominal:     General: Bowel sounds are normal.     Palpations: Abdomen is soft.     Tenderness: There is no abdominal tenderness. There is no guarding or rebound.  Musculoskeletal:        General: Normal range of motion.     Cervical back: Normal range of motion and neck supple.      Comments: 2+ edema to lower extremities bilaterally.  There is some mild erythema to the pretibial areas.  Mild warmth.  There is no redness extending proximal to the knee.  There is some open ulcerated areas that are weeping on both legs.  Pedal pulses are intact.  Lymphadenopathy:     Cervical: No cervical adenopathy.  Skin:    General: Skin is warm and dry.     Findings: No rash.  Neurological:     Mental Status: He is alert and oriented to person, place, and time.     ED Results / Procedures / Treatments   Labs (all labs ordered are listed, but only abnormal results are displayed) Labs Reviewed  BASIC METABOLIC PANEL - Abnormal; Notable for the following components:      Result Value   Calcium 8.8 (*)    All other components within normal limits  BRAIN NATRIURETIC PEPTIDE  CBC WITH DIFFERENTIAL/PLATELET    EKG Daniel Summers  Radiology DG Chest 2 View  Result Date: 01/10/2023 CLINICAL DATA:  57 year old male with history of bilateral lower extremity swelling. EXAM: CHEST - 2 VIEW COMPARISON:  Chest x-ray 01/22/2021. FINDINGS: Lung volumes are normal. No consolidative airspace disease. No pleural effusions. No pneumothorax. No evidence of pulmonary edema. Heart size and mediastinal contours are distorted by patient's rotation to the right. IMPRESSION: 1. No radiographic evidence of acute cardiopulmonary disease. Electronically Signed   By: Trudie Reed M.D.   On: 01/10/2023 13:59   US Venous Img Lower Bilateral  Result Date: 01/10/2023 CLINICAL DATA:  57 year old male with history of bilateral leg pain and swelling. EXAM: BILATERAL LOWER EXTREMITY VENOUS DOPPLER ULTRASOUND TECHNIQUE: Gray-scale sonography with graded compression, as well as color Doppler and duplex ultrasound were performed to evaluate the lower extremity deep venous systems from the level of the common femoral vein and including the common femoral, femoral, profunda femoral, popliteal and calf veins including the  posterior tibial, peroneal and gastrocnemius veins when visible. The superficial great saphenous vein was also interrogated. Spectral Doppler was utilized to evaluate flow at rest and with distal augmentation maneuvers in the common femoral, femoral and popliteal veins. COMPARISON:  Daniel Summers Available. FINDINGS: RIGHT LOWER EXTREMITY Common Femoral Vein: No evidence of thrombus. Normal compressibility, respiratory phasicity and response to augmentation. Saphenofemoral Junction: No evidence of thrombus. Normal compressibility and flow on color Doppler imaging. Profunda Femoral Vein: No evidence of thrombus. Normal compressibility and flow on color Doppler imaging. Femoral Vein: No evidence of thrombus. Normal compressibility, respiratory phasicity and response to augmentation. Popliteal Vein: No evidence of thrombus. Normal compressibility, respiratory phasicity  and response to augmentation. Calf Veins: Peroneal vein poorly visualized secondary to edema. Within the visualized veins there is no evidence of thrombus. Normal compressibility and flow on color Doppler imaging. Superficial Great Saphenous Vein: No evidence of thrombus. Normal compressibility. Venous Reflux:  Daniel Summers. Other Findings:  Diffuse soft tissue edema. LEFT LOWER EXTREMITY Common Femoral Vein: No evidence of thrombus. Normal compressibility, respiratory phasicity and response to augmentation. Saphenofemoral Junction: No evidence of thrombus. Normal compressibility and flow on color Doppler imaging. Profunda Femoral Vein: No evidence of thrombus. Normal compressibility and flow on color Doppler imaging. Femoral Vein: No evidence of thrombus. Normal compressibility, respiratory phasicity and response to augmentation. Popliteal Vein: No evidence of thrombus. Normal compressibility, respiratory phasicity and response to augmentation. Calf Veins: Posterior tibial vein poorly visualized, and peroneal vein not visualized secondary to edema. Superficial Great  Saphenous Vein: No evidence of thrombus. Normal compressibility. Venous Reflux:  Daniel Summers. Other Findings:  Diffuse soft tissue edema. IMPRESSION: No evidence of deep venous thrombosis in either lower extremity (with limited visualization of the calf veins secondary to edema, as above). Electronically Signed   By: Trudie Reed M.D.   On: 01/10/2023 13:53    Procedures Procedures    Medications Ordered in ED Medications  ceFAZolin (ANCEF) IVPB 2g/100 mL premix (2 g Intravenous New Bag/Given 01/10/23 1433)  bacitracin ointment (31.5 Applications Topical Given 01/10/23 1428)    ED Course/ Medical Decision Making/ A&P                                 Medical Decision Making Amount and/or Complexity of Data Reviewed Labs: ordered. Radiology: ordered.  Risk OTC drugs. Prescription drug management.   Patient is a 57 year old who presents with worsening swelling of his lower legs associated with redness to the legs and some wounds.  Reportedly does also pick at the wounds.  He has no fevers.  He is otherwise well-appearing.  He is here with his sister who he lives with.  Labs reviewed and are nonconcerning.  Chest x-ray was interpreted by me confirmed by the radiologist to show no evidence of pneumonia or pulmonary edema.  No other signs of fluid overload.  His BNP is normal.  ED function is normal.  Ultrasound was performed of the legs which shows no evidence of DVTs.  On reexam of the ulcerated areas, there is some crusting.  Suspect that he might have overlying infection.  Will go ahead and start antibiotics and mupirocin ointment.  He was given dose of Ancef in the ED.  Feel that outpatient trial of antibiotics is appropriate.  Discussed this with the family who is also on board with this.  They do feel that he would be better at home than in the hospital.  He does not appear systemically ill.  His blood pressure is little bit on the low side but based on chart review, it is typical for him from  prior visits.  He was discharged home in good condition.  Was started on doxycycline and mupirocin ointment.  Advised family members to have the patient have close follow-up with his PCP within the next couple of days for recheck.  They were given strict return precautions.  Final Clinical Impression(s) / ED Diagnoses Final diagnoses:  Bilateral lower extremity edema  Cellulitis of lower extremity, unspecified laterality    Rx / DC Orders ED Discharge Orders  Ordered    doxycycline (VIBRAMYCIN) 100 MG capsule  2 times daily        01/10/23 1422    mupirocin cream (BACTROBAN) 2 %  2 times daily        01/10/23 1422              Rolan Bucco, MD 01/10/23 1452

## 2023-01-10 NOTE — ED Notes (Signed)
Family at bedside. 

## 2023-01-10 NOTE — ED Notes (Signed)
Fall risk armband Fall risk socks Fall risk sign on door

## 2023-01-10 NOTE — ED Triage Notes (Signed)
BLLE swelling and redness onset last night with weeping.

## 2023-01-10 NOTE — Discharge Instructions (Addendum)
Keep your legs elevated is much as possible.  Take the antibiotics as discussed.  Return to emergency if you have any worsening symptoms.  Have close follow-up with your primary care doctor for recheck.

## 2023-02-13 ENCOUNTER — Other Ambulatory Visit: Payer: Self-pay | Admitting: Cardiology

## 2023-05-02 ENCOUNTER — Other Ambulatory Visit: Payer: Self-pay | Admitting: Cardiology

## 2023-06-19 ENCOUNTER — Encounter: Payer: Self-pay | Admitting: Physician Assistant

## 2023-06-19 ENCOUNTER — Ambulatory Visit: Attending: Physician Assistant | Admitting: Physician Assistant

## 2023-06-19 VITALS — BP 100/58 | HR 76 | Ht 69.0 in

## 2023-06-19 DIAGNOSIS — I471 Supraventricular tachycardia, unspecified: Secondary | ICD-10-CM | POA: Diagnosis not present

## 2023-06-19 DIAGNOSIS — R600 Localized edema: Secondary | ICD-10-CM | POA: Diagnosis not present

## 2023-06-19 DIAGNOSIS — Z79899 Other long term (current) drug therapy: Secondary | ICD-10-CM | POA: Diagnosis not present

## 2023-06-19 DIAGNOSIS — Z5181 Encounter for therapeutic drug level monitoring: Secondary | ICD-10-CM | POA: Diagnosis not present

## 2023-06-19 MED ORDER — METOPROLOL SUCCINATE ER 25 MG PO TB24: 25.0000 mg | ORAL_TABLET | Freq: Two times a day (BID) | ORAL | 3 refills | Status: AC

## 2023-06-19 MED ORDER — FLECAINIDE ACETATE 50 MG PO TABS: ORAL_TABLET | ORAL | 3 refills | Status: AC

## 2023-06-19 NOTE — Progress Notes (Signed)
 Cardiology Office Note:  .   Date:  06/19/2023  ID:  Daniel Summers, DOB [birth year], MRN [id number] PCP: Daniel Ore, NP  [facility] Providers Cardiologist:  None Electrophysiologist:  Lanier Prude, MD {  History of Present Illness: .   Daniel Summers is a 58 y.o. male w/PMHx of  Autism, deaf SVT  Last seen here by Dr. Lalla Brothers 01/28/22, sister was with him, she reported concerns of low BPs This visit 106/58 Stable EKG/intervals No plans to pursue ablation with chronic fungal infection groins  ER 01/10/23 with LE swelling weeping wounds, ,no SOB Treated with ancef/bactroban ointment >> docy to go home Normal BNP, neg US DVTs,  Did not appear systemically ill, family felt he would do best at home rather then an admission advised close PMD follow up   Today's visit is scheduled as a 6 mo visit  ROS:   He comes today accompanied by his sister The patient is deaf she helps communicate with sign language He has not had any SVT since on flecainide/current medications No reports of syncope, near syncope  Unfortunately despite her best efforts with his PMD have not been able to eradicate the fungal infections/groin/under his pannus. They are going to look into/discuss perhaps weight loss medication injection options.  Note edema and wounds today She had not noticed, says that he is typically very good to let her know when he has any sores/wounds and did not tell her. Hthis apparently happens intermittently and she is able to get them wrapped/cleaned up treated with fairly good and quick resolution   Arrhythmia/AAD hx SVT found May 2022 Ablation planned > aborted 2/2 groin rash Flecainide started Aug 2022  Studies Reviewed: Marland Kitchen    EKG done today and reviewed by myself:  SR 76bpm, icRBBB, PR , QRS Stable intervals from last  05/07/20: TTE  1. Left ventricular ejection fraction, by estimation, is 60 to 65%. The  left ventricle has normal function.  Left ventricular endocardial border  not optimally defined to evaluate regional wall motion. Left ventricular  diastolic parameters were normal.   2. Right ventricular systolic function is normal. The right ventricular  size is normal. Tricuspid regurgitation signal is inadequate for assessing  PA pressure.   3. The mitral valve is grossly normal. No evidence of mitral valve  regurgitation. No evidence of mitral stenosis.   4. The aortic valve is tricuspid. Aortic valve regurgitation is not  visualized. No aortic stenosis is present.   5. The inferior vena cava is normal in size with greater than 50%  respiratory variability, suggesting right atrial pressure of 3 mmHg.   Conclusion(s)/Recommendation(s): Normal biventricular function without  evidence of hemodynamically significant valvular heart disease.    Risk Assessment/Calculations:    Physical Exam:   VS:  There were no vitals taken for this visit.   Wt Readings from Last 3 Encounters:  01/28/22 (!) 313 lb (142 kg)  01/22/21 (!) 301 lb (136.5 kg)  11/06/20 292 lb 3.2 oz (132.5 kg)    GEN: Well nourished, well developed in no acute distress NECK: No JVD; No carotid bruits CARDIAC: RRR, no murmurs, rubs, gallops RESPIRATORY:  CTA b/l without rales, wheezing or rhonchi  ABDOMEN: Soft, non-tender, non-distended EXTREMITIES: b/l marked edema, superficial though fairly large area wounds, no active drainage, no puss or odor, R>L   ASSESSMENT AND PLAN: .    SVT Flecainide w/metoprolol, icRBBB,  stable intervals  Suggested that she discuss with his PMD Dermatology  and or ID referral to help with his chronic fungal infection As well as f/u with his PMD regarding legs Though did refer to [facility] and the lymphedema clinic to see if thaey can help given seems a recurring problem In my review, last ER visit with the same > BNP was normal, not felt to be cardiac driven Lungs are very clear She says that they have really been trying to  avoid diuretics and the wraps, skin care do get it back under control quickly Advised elevation LE when seated     Dispo: back with EP again in 19mo, sooner if needed    Signed, Sheilah Pigeon, PA-C

## 2023-06-19 NOTE — Patient Instructions (Addendum)
 Medication Instructions:   Your physician recommends that you continue on your current medications as directed. Please refer to the Current Medication list given to you today.   *If you need a refill on your cardiac medications before your next appointment, please call your pharmacy*   Lab Work: NONE ORDERED  TODAY    If you have labs (blood work) drawn today and your tests are completely normal, you will receive your results only by: MyChart Message (if you have MyChart) OR A paper copy in the mail If you have any lab test that is abnormal or we need to change your treatment, we will call you to review the results.   Testing/Procedures: NONE ORDERED  TODAY    Follow-Up: At [facility], you and your health needs are our priority.  As part of our continuing mission to provide you with exceptional heart care, we have created designated Provider Care Teams.  These Care Teams include your primary Cardiologist (physician) and Advanced Practice Providers (APPs -  Physician Assistants and Nurse Practitioners) who all work together to provide you with the care you need, when you need it.  We recommend signing up for the patient portal called "MyChart".  Sign up information is provided on this After Visit Summary.  MyChart is used to connect with patients for Virtual Visits (Telemedicine).  Patients are able to view lab/test results, encounter notes, upcoming appointments, etc.  Non-urgent messages can be sent to your provider as well.   To learn more about what you can do with MyChart, go to [email or web address].    Your next appointment:   you have been refereed to Vein and Vascular  and   Lymphedema Clinic   4 month(s) ( CONTACT  CASSIE HALL/ ANGELINE HAMMER FOR EP SCHEDULING ISSUES )   Provider:    You may see Lanier Prude, MD or one of the following Advanced Practice Providers on your designated Care Team:   Francis Dowse, [place]     Other Instructions    1st  Floor: - Lobby - Registration  - Pharmacy  - Lab - Cafe  2nd Floor: - PV Lab - Diagnostic Testing (echo, CT, nuclear med)  3rd Floor: - Vacant  4th Floor: - TCTS (cardiothoracic surgery) - AFib Clinic - Structural Heart Clinic - Vascular Surgery  - Vascular Ultrasound  5th Floor: - HeartCare Cardiology (general and EP) - Clinical Pharmacy for coumadin, hypertension, lipid, weight-loss medications, and med management appointments    Valet parking services will be available as well.

## 2023-07-18 IMAGING — CR DG CHEST 2V
2 series · 2 of 2 positions shown · non-contrast
Comparison: August 14, 2020

CLINICAL DATA: Chest pain.

EXAM:
CHEST - 2 VIEW

[w chest pa *]
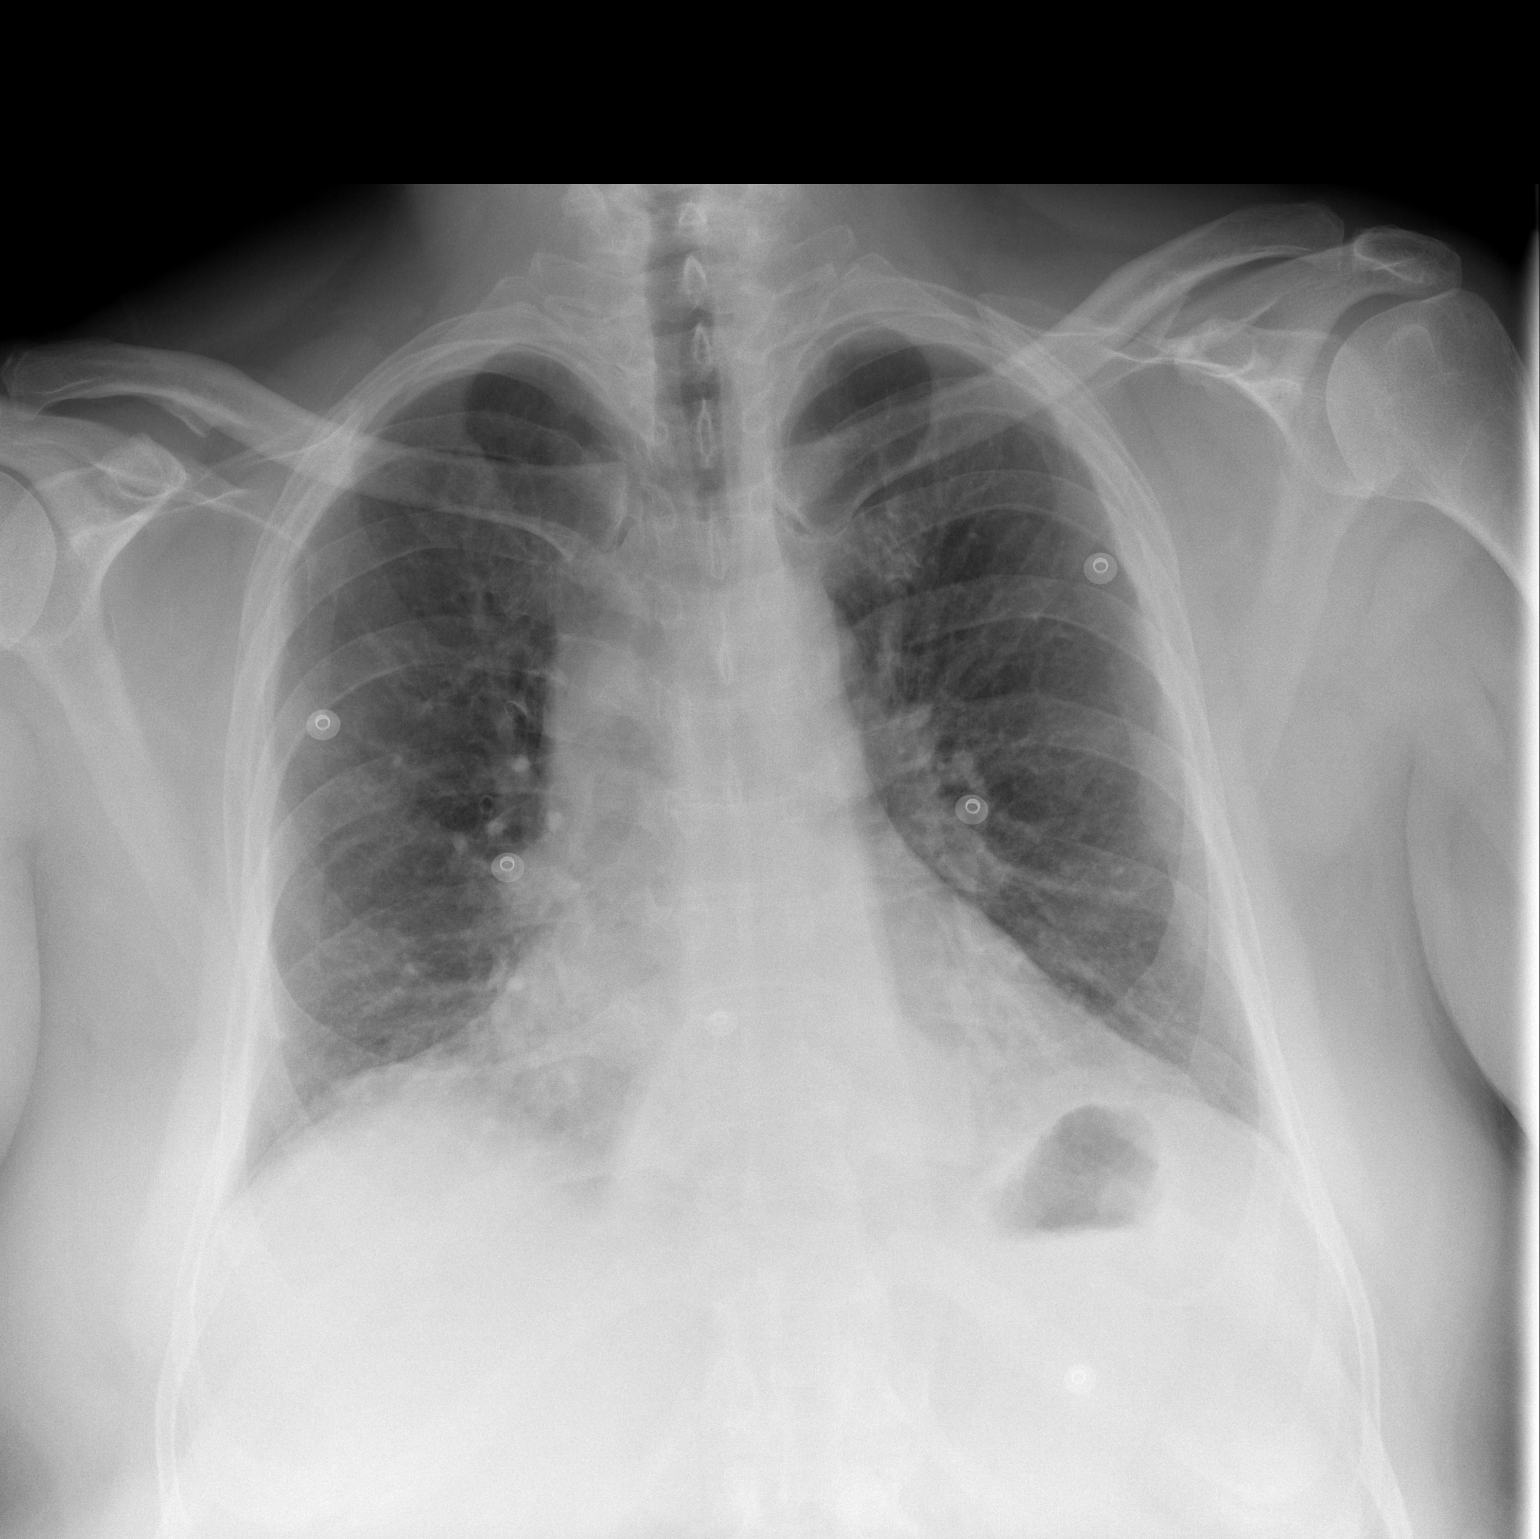

[w chest lat]
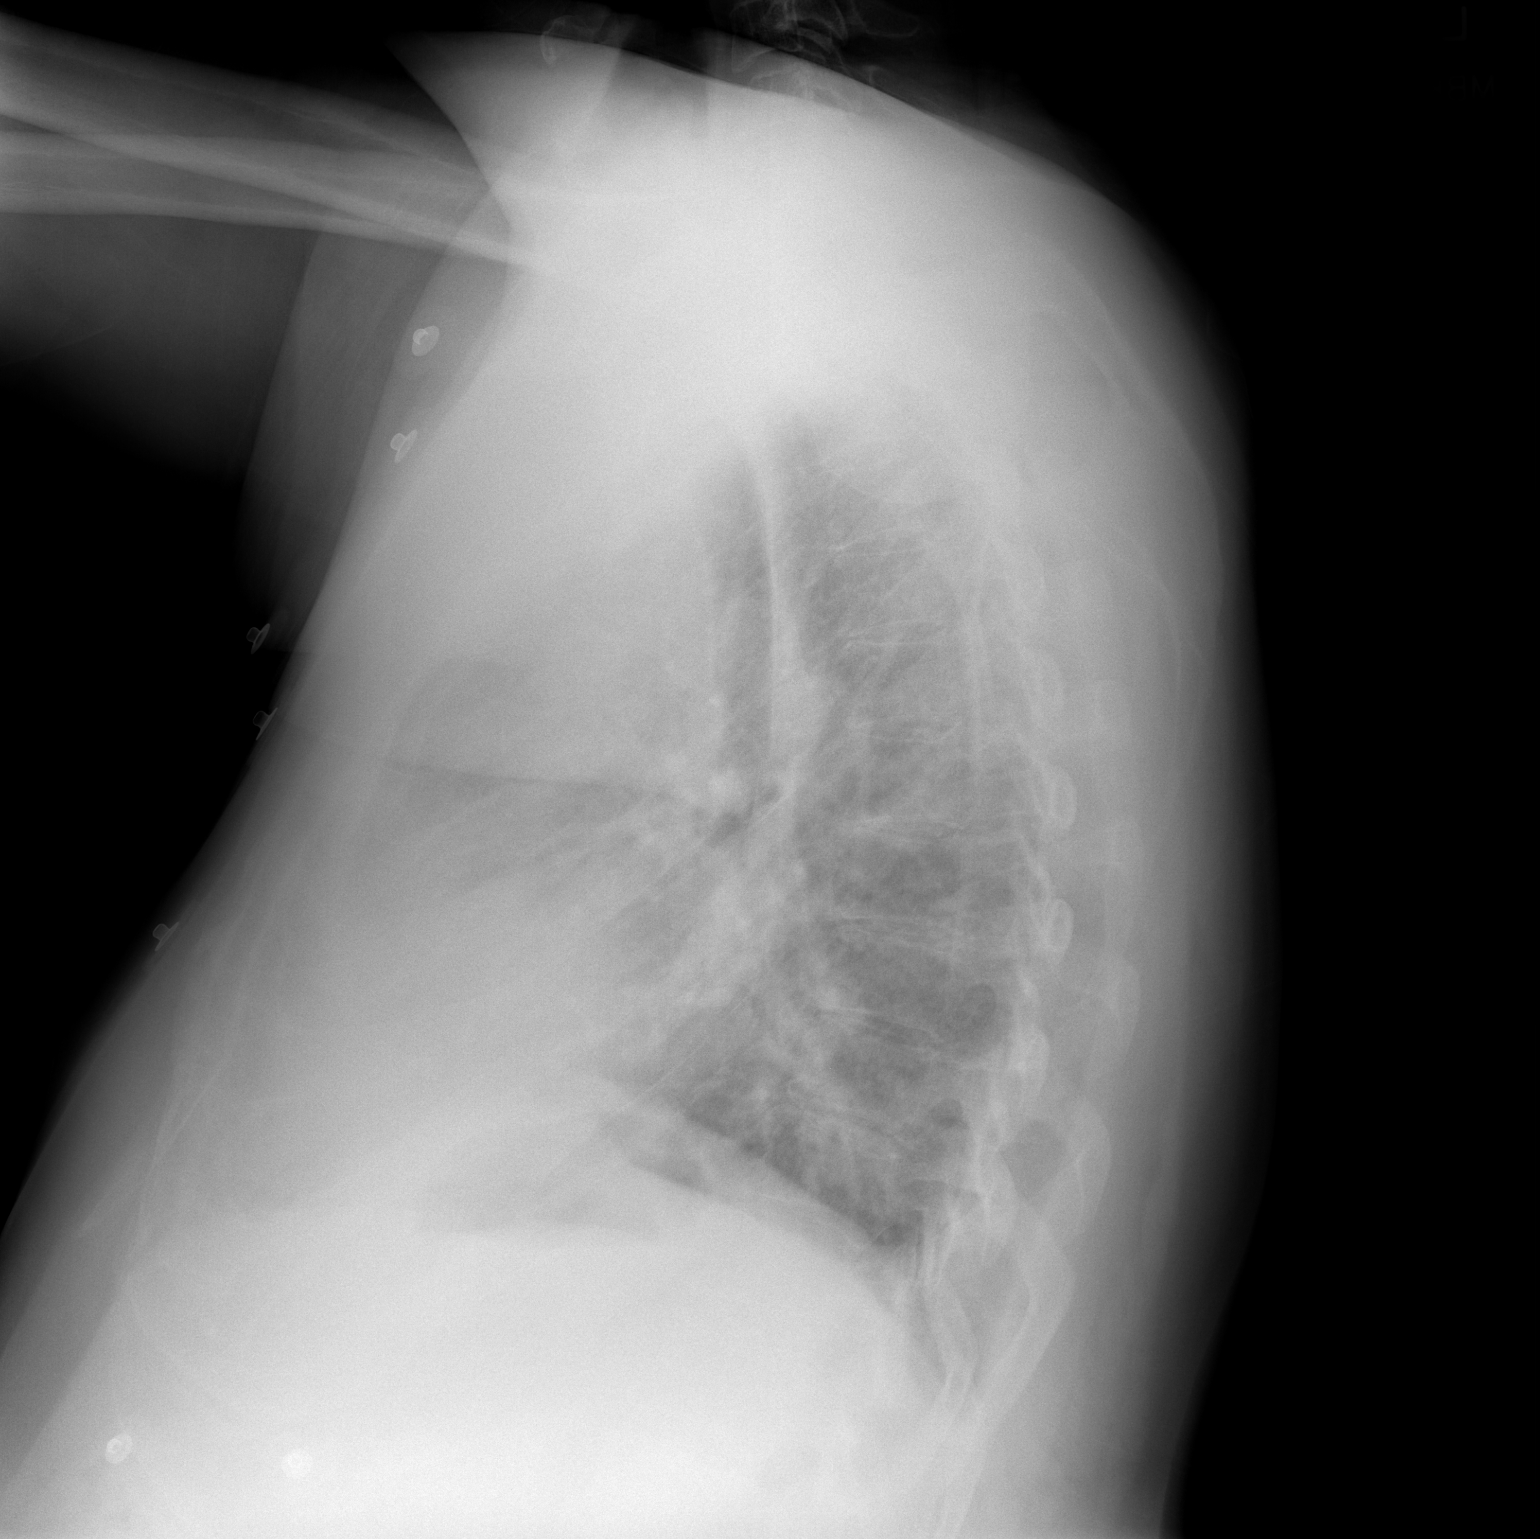

[2 of 2 positions shown; findings below may reference images not displayed]

FINDINGS: There is no evidence of acute infiltrate, pleural effusion or
pneumothorax. The heart size and mediastinal contours are within
normal limits. A small hiatal hernia is seen. The visualized
skeletal structures are unremarkable.
IMPRESSION: No active cardiopulmonary disease.

## 2023-07-31 ENCOUNTER — Other Ambulatory Visit: Payer: Self-pay | Admitting: *Deleted

## 2023-07-31 DIAGNOSIS — M7989 Other specified soft tissue disorders: Secondary | ICD-10-CM

## 2023-08-05 ENCOUNTER — Ambulatory Visit (HOSPITAL_COMMUNITY)
Admission: RE | Admit: 2023-08-05 | Discharge: 2023-08-05 | Disposition: A | Discharge status: Home / Self Care | Source: Ambulatory Visit | Attending: Vascular Surgery | Admitting: Vascular Surgery

## 2023-08-05 DIAGNOSIS — M7989 Other specified soft tissue disorders: Secondary | ICD-10-CM | POA: Diagnosis present

## 2023-08-11 ENCOUNTER — Ambulatory Visit: Attending: Vascular Surgery | Admitting: Physician Assistant

## 2023-08-11 VITALS — BP 109/67 | HR 60 | Temp 98.2°F | Wt 319.4 lb

## 2023-08-11 DIAGNOSIS — M7989 Other specified soft tissue disorders: Secondary | ICD-10-CM | POA: Diagnosis not present

## 2023-08-11 NOTE — Progress Notes (Signed)
 [facility] [place]   Reason for referral: Swollen B leg  History of Present Illness  Daniel Summers is a 58 y.o. male who presents with chief complaint: swollen leg.  Patient notes, onset of swelling years  ago, associated with dependent position all day.  The patient has had no history of DVT, no history of varicose vein, no history of venous stasis ulcers, no history of  Lymphedema and positive history of skin changes in lower legs.  There is no family history of venous disorders.  The patient has used compression stockings in the past.   His sister is with him and is his guardian.  He has autism and mental retardation.  He spends most of his day sitting with his feet on the ground.  She works out of the home.  He has had several non healing wounds/sores and he "picks " at his skin.   They have used una boot wraps and ace wraps for compression in the past.    Past Medical History:  Diagnosis Date   Asthma    Autistic disorder of childhood onset    Bilateral cataracts 05/02/2011   Deaf    History of blood transfusion    At Birth   Mental retardation 05/02/2011   Osteoarthritis    right ankle   Tachycardia     Past Surgical History:  Procedure Laterality Date   EYE SURGERY     HERNIA REPAIR     INGUINAL HERNIA REPAIR      Social History   Socioeconomic History   Marital status: Single    Spouse name: Not on file   Number of children: Not on file   Years of education: Not on file   Highest education level: Not on file  Occupational History   Not on file  Tobacco Use   Smoking status: Never   Smokeless tobacco: Never  Vaping Use   Vaping status: Not on file  Substance and Sexual Activity   Alcohol use: No   Drug use: No   Sexual activity: Not Currently  Other Topics Concern   Not on file  Social History Narrative   Not on file   Social Drivers of Health   Financial Resource Strain: Not on file  Food Insecurity: Not on file  Transportation  Needs: Not on file  Physical Activity: Not on file  Stress: Not on file  Social Connections: Unknown (08/13/2021)   Received from [facility], [facility]   Social Network    Social Network: Not on file  Intimate Partner Violence: Unknown (07/05/2021)   Received from [employer], [facility]   HITS    Physically Hurt: Not on file    Insult or Talk Down To: Not on file    Threaten Physical Harm: Not on file    Scream or Curse: Not on file    Family History  Problem Relation Age of Onset   Diabetes Father    Coronary artery disease Father    Heart disease Other    Hypertension Other    Diabetes Other    Alcohol abuse Other     Current Outpatient Medications on File Prior to Visit  Medication Sig Dispense Refill   clotrimazole  (LOTRIMIN ) 1 % cream Apply to affected area 2 times daily for up to 4 weeks 60 g 0   doxycycline  (VIBRAMYCIN ) 100 MG capsule Take 1 capsule (100 mg total) by mouth 2 (two) times daily. One po bid x 7  days 14 capsule 0   flecainide  (TAMBOCOR ) 50 MG tablet TAKE 1 AND 1/2 TABLETS(75 MG) BY MOUTH TWICE DAILY 270 tablet 3   fluconazole (DIFLUCAN) 150 MG tablet Take 150 mg by mouth once.     ibuprofen  (ADVIL ) 200 MG tablet Take 200-400 mg by mouth daily as needed (pain).     metoprolol  succinate (TOPROL  XL) 25 MG 24 hr tablet Take 1 tablet (25 mg total) by mouth in the morning and at bedtime. 180 tablet 3   mupirocin  cream (BACTROBAN ) 2 % Apply 1 Application topically 2 (two) times daily. 15 g 0   No current facility-administered medications on file prior to visit.    Allergies as of 08/11/2023   (No Known Allergies)     ROS:   General:  No weight loss, Fever, chills  HEENT: No recent headaches, no nasal bleeding, no visual changes, no sore throat  Neurologic: No dizziness, blackouts, seizures. No recent symptoms of stroke or mini- stroke. No recent episodes of slurred speech, or temporary blindness.  Cardiac: No recent episodes of chest  pain/pressure, no shortness of breath at rest.  No shortness of breath with exertion.  Denies history of atrial fibrillation or irregular heartbeat  Vascular: No history of rest pain in feet.  No history of claudication.  positive history of non-healing ulcer, No history of DVT   Pulmonary: No home oxygen, no productive cough, no hemoptysis,  No asthma or wheezing  Musculoskeletal:  [ ]  Arthritis, [ ]  Low back pain,  [ ]  Joint pain  Hematologic:No history of hypercoagulable state.  No history of easy bleeding.  No history of anemia  Gastrointestinal: No hematochezia or melena,  No gastroesophageal reflux, no trouble swallowing  Urinary: [ ]  chronic Kidney disease, [ ]  on HD - [ ]  MWF or [ ]  TTHS, [ ]  Burning with urination, [ ]  Frequent urination, [ ]  Difficulty urinating;   Skin: No rashes  Psychological: No history of anxiety,  No history of depression  Physical Examination  Vitals:   08/11/23 0952  BP: 109/67  Pulse: 60  Temp: 98.2 F (36.8 C)  TempSrc: Temporal  SpO2: 90%  Weight: (!) 319 lb 6.4 oz (144.9 kg)    Body mass index is 47.17 kg/m.  General:  Alert and oriented, no acute distress HEENT: Normal Neck: No bruit or JVD Pulmonary: Clear to auscultation bilaterally Cardiac: Regular Rate and Rhythm without murmur Abdomen: Soft, non-tender, non-distended, no mass, no scars Skin: No rash     Extremity Pulses:   radial,  posterior tibial pulses bilaterally Musculoskeletal: mild edema  Neurologic: Upper and lower extremity motor grossly intact and symmetric  DATA: +--------------+---------+------+-----------+------------+--------+  LEFT         Reflux NoRefluxReflux TimeDiameter cmsComments                          Yes                                   +--------------+---------+------+-----------+------------+--------+  CFV          no                                               +--------------+---------+------+-----------+------------+--------+  FV mid  no                                              +--------------+---------+------+-----------+------------+--------+  Popliteal    no                                              +--------------+---------+------+-----------+------------+--------+  GSV at [facility]    no                            0.82              +--------------+---------+------+-----------+------------+--------+  GSV prox thighno                            0.68              +--------------+---------+------+-----------+------------+--------+  GSV mid thigh no                            0.74              +--------------+---------+------+-----------+------------+--------+  GSV dist thighno                            0.74              +--------------+---------+------+-----------+------------+--------+  GSV at knee   no                            0.63              +--------------+---------+------+-----------+------------+--------+  GSV prox calf no                            0.61              +--------------+---------+------+-----------+------------+--------+  SSV Pop Fossa no                            0.52              +--------------+---------+------+-----------+------------+--------+    Summary:  Left:  - No evidence of deep vein thrombosis from the common femoral through the  popliteal veins.  - No evidence of superficial venous thrombosis.  - The deep venous system is competent.  - The great and small saphenous veins are competent.      Assessment/Plan: Chronic dependent edema without venous reflux. I gave them the vein hand out.  They will continue to use una boots PRN/ace wraps/ even compression socks depending the day and how he allows them to put them on him.  I suggested a elevation multiple times a day.  She states they have a schedule for him to check off things while  she is at work and she will add this.  He likes to get in the pool and they have a neighborhood pool.    He has palpable pedal pulses and is not at risk of limb loss.  No venous intervention is indicated.  F/U PRN.  Rocky Cipro PA-C [facility] [place] Office: [phone]  MD in clinic [place]

## 2023-10-11 NOTE — Progress Notes (Unsigned)
 Cardiology Office Note:  .   Date:  10/14/2023  ID:  DEMONIE KASSA, DOB [birth year], MRN [id number] PCP: Lunger Channel, NP  [facility] Providers Cardiologist:  None Electrophysiologist:  OLE ONEIDA HOLTS, MD {  History of Present Illness: .   Daniel Summers is a 58 y.o. male w/PMHx of  Autism, deaf SVT  Last seen here by Dr. HOLTS 01/28/22, sister was with him, she reported concerns of low BPs This visit 106/58 Stable EKG/intervals No plans to pursue ablation with chronic fungal infection groins  ER 01/10/23 with LE swelling weeping wounds, ,no SOB Treated with ancef /bactroban  ointment >> doxy to go home Normal BNP, neg US  DVTs,  Did not appear systemically ill, family felt he would do best at home rather then an admission advised close PMD follow up  I saw him 06/19/23 He comes today accompanied by his sister The patient is deaf she helps communicate with sign language He has not had any SVT since on flecainide /current medications No reports of syncope, near syncope Unfortunately despite her/thier best efforts with his PMD have not been able to eradicate the fungal infections/groin/under his pannus. They are going to look into/discuss perhaps weight loss medication injection options. EKG w/stable intervals No changes Referred to [facility] edema clinic to see if they had anything to offer for his edema/chronic LE infections  Saw [facility] 08/11/23: rec: continue to use una boots PRN/ace wraps/ even compression socks depending the day and how he allows, elevation  He has palpable pedal pulses and is not at risk of limb loss. No venous intervention is indicated   Today's visit is scheduled as a 4 mo visit ROS:   He is accompanied by his sister No palpitations or SVTs She states he feels that immediately and tells her and since his last visit has not had any. No reports of any cardiac/health concerns really at all They continue work on his edema/wounds at home w/PMD At a  pretty steady place currently   Arrhythmia/AAD hx SVT found May 2022 Ablation planned > aborted 2/2 groin rash Flecainide  started Aug 2022  Studies Reviewed: SABRA    EKG done today and reviewed by myself:  SR 70bpm, icRBBB, PR , QRS , QTc ,   06/19/23: SR 76bpm, icRBBB, PR , QRS Stable intervals from last  05/07/20: TTE  1. Left ventricular ejection fraction, by estimation, is 60 to 65%. The  left ventricle has normal function. Left ventricular endocardial border  not optimally defined to evaluate regional wall motion. Left ventricular  diastolic parameters were normal.   2. Right ventricular systolic function is normal. The right ventricular  size is normal. Tricuspid regurgitation signal is inadequate for assessing  PA pressure.   3. The mitral valve is grossly normal. No evidence of mitral valve  regurgitation. No evidence of mitral stenosis.   4. The aortic valve is tricuspid. Aortic valve regurgitation is not  visualized. No aortic stenosis is present.   5. The inferior vena cava is normal in size with greater than 50%  respiratory variability, suggesting right atrial pressure of 3 mmHg.   Conclusion(s)/Recommendation(s): Normal biventricular function without  evidence of hemodynamically significant valvular heart disease.    Risk Assessment/Calculations:    Physical Exam:   VS:  BP 127/78   Pulse 70   Ht 5' 9 (1.753 m)   Wt (!) 315 lb (142.9 kg)   SpO2 94%   BMI 46.52 kg/m    Wt Readings from Last 3  Encounters:  10/14/23 (!) 315 lb (142.9 kg)  08/11/23 (!) 319 lb 6.4 oz (144.9 kg)  01/28/22 (!) 313 lb (142 kg)    GEN: Well nourished, well developed in no acute distress NECK: No JVD; No carotid bruits CARDIAC: RRR, no murmurs, rubs, gallops RESPIRATORY: CTA b/l without rales, wheezing or rhonchi  ABDOMEN: Soft, non-tender, non-distended EXTREMITIES: brawny edema, erythema, superficial though wounds, no active drainage, no puss or  odor  ASSESSMENT AND PLAN: .    SVT Flecainide  w/metoprolol , icRBBB, stable intervals No palpitations/tachycardias   2. Chronic LE edema Recurrent/persistent fungal infections Recs as per [facility] and his PMD    Dispo: back with EP again in 77mo, sooner if needed    Signed, Charlies Macario Arthur, PA-C

## 2023-10-14 ENCOUNTER — Ambulatory Visit: Attending: Physician Assistant | Admitting: Physician Assistant

## 2023-10-14 ENCOUNTER — Encounter: Payer: Self-pay | Admitting: Physician Assistant

## 2023-10-14 VITALS — BP 127/78 | HR 70 | Ht 69.0 in | Wt 315.0 lb

## 2023-10-14 DIAGNOSIS — Z79899 Other long term (current) drug therapy: Secondary | ICD-10-CM

## 2023-10-14 DIAGNOSIS — I471 Supraventricular tachycardia, unspecified: Secondary | ICD-10-CM | POA: Diagnosis not present

## 2023-10-14 DIAGNOSIS — Z5181 Encounter for therapeutic drug level monitoring: Secondary | ICD-10-CM | POA: Diagnosis not present

## 2023-10-14 NOTE — Patient Instructions (Signed)
 Medication Instructions:    Your physician recommends that you continue on your current medications as directed. Please refer to the Current Medication list given to you today.   *If you need a refill on your cardiac medications before your next appointment, please call your pharmacy*   Lab Work: NONE ORDERED  TODAY    If you have labs (blood work) drawn today and your tests are completely normal, you will receive your results only by: MyChart Message (if you have MyChart) OR A paper copy in the mail If you have any lab test that is abnormal or we need to change your treatment, we will call you to review the results.    Testing/Procedures: NONE ORDERED  TODAY     Follow-Up: At [facility], you and your health needs are our priority.  As part of our continuing mission to provide you with exceptional heart care, our providers are all part of one team.  This team includes your primary Cardiologist (physician) and Advanced Practice Providers or APPs (Physician Assistants and Nurse Practitioners) who all work together to provide you with the care you need, when you need it.  Your next appointment:    6 month(s)   Provider:    You may see Boyce Byes, MD or one of the following Advanced Practice Providers on your designated Care Team:   Mertha Abrahams, [place]      We recommend signing up for the patient portal called "MyChart".  Sign up information is provided on this After Visit Summary.  MyChart is used to connect with patients for Virtual Visits (Telemedicine).  Patients are able to view lab/test results, encounter notes, upcoming appointments, etc.  Non-urgent messages can be sent to your provider as well.   To learn more about what you can do with MyChart, go to [email or web address].   Other Instructions
